# Patient Record
Sex: Male | Born: 1950 | Race: White | Marital: Married | Smoking: Former smoker
Health system: Southern US, Community
[De-identification: ages and names within clinical notes are randomized; demographics above are authoritative.]

## PROBLEM LIST (undated history)

## (undated) DIAGNOSIS — F419 Anxiety disorder, unspecified: Secondary | ICD-10-CM

## (undated) DIAGNOSIS — J189 Pneumonia, unspecified organism: Secondary | ICD-10-CM

## (undated) DIAGNOSIS — A419 Sepsis, unspecified organism: Secondary | ICD-10-CM

## (undated) DIAGNOSIS — E7800 Pure hypercholesterolemia, unspecified: Secondary | ICD-10-CM

## (undated) DIAGNOSIS — I509 Heart failure, unspecified: Secondary | ICD-10-CM

## (undated) DIAGNOSIS — K219 Gastro-esophageal reflux disease without esophagitis: Secondary | ICD-10-CM

## (undated) DIAGNOSIS — G4730 Sleep apnea, unspecified: Secondary | ICD-10-CM

## (undated) DIAGNOSIS — I2510 Atherosclerotic heart disease of native coronary artery without angina pectoris: Secondary | ICD-10-CM

## (undated) DIAGNOSIS — E119 Type 2 diabetes mellitus without complications: Secondary | ICD-10-CM

## (undated) DIAGNOSIS — I4891 Unspecified atrial fibrillation: Secondary | ICD-10-CM

## (undated) DIAGNOSIS — I10 Essential (primary) hypertension: Secondary | ICD-10-CM

## (undated) HISTORY — PX: PICC LINE PLACE PERIPHERAL (ARMC HX): HXRAD1248

## (undated) HISTORY — PX: CHOLECYSTECTOMY: SHX55

## (undated) HISTORY — PX: KNEE SURGERY: SHX244

---

## 2016-08-20 DIAGNOSIS — I2510 Atherosclerotic heart disease of native coronary artery without angina pectoris: Secondary | ICD-10-CM | POA: Diagnosis not present

## 2016-08-20 DIAGNOSIS — R000 Tachycardia, unspecified: Secondary | ICD-10-CM

## 2016-08-20 DIAGNOSIS — E119 Type 2 diabetes mellitus without complications: Secondary | ICD-10-CM

## 2016-08-20 DIAGNOSIS — J449 Chronic obstructive pulmonary disease, unspecified: Secondary | ICD-10-CM

## 2016-08-20 DIAGNOSIS — J9621 Acute and chronic respiratory failure with hypoxia: Secondary | ICD-10-CM

## 2016-08-20 DIAGNOSIS — F419 Anxiety disorder, unspecified: Secondary | ICD-10-CM | POA: Diagnosis not present

## 2016-08-21 DIAGNOSIS — I2510 Atherosclerotic heart disease of native coronary artery without angina pectoris: Secondary | ICD-10-CM | POA: Diagnosis not present

## 2016-08-21 DIAGNOSIS — F419 Anxiety disorder, unspecified: Secondary | ICD-10-CM | POA: Diagnosis not present

## 2016-08-21 DIAGNOSIS — J9621 Acute and chronic respiratory failure with hypoxia: Secondary | ICD-10-CM | POA: Diagnosis not present

## 2016-08-21 DIAGNOSIS — R000 Tachycardia, unspecified: Secondary | ICD-10-CM | POA: Diagnosis not present

## 2016-08-22 DIAGNOSIS — J449 Chronic obstructive pulmonary disease, unspecified: Secondary | ICD-10-CM

## 2016-08-22 DIAGNOSIS — E119 Type 2 diabetes mellitus without complications: Secondary | ICD-10-CM

## 2016-08-22 DIAGNOSIS — R000 Tachycardia, unspecified: Secondary | ICD-10-CM

## 2016-08-22 DIAGNOSIS — F419 Anxiety disorder, unspecified: Secondary | ICD-10-CM

## 2016-08-22 DIAGNOSIS — J9621 Acute and chronic respiratory failure with hypoxia: Secondary | ICD-10-CM | POA: Diagnosis not present

## 2016-08-22 DIAGNOSIS — I2510 Atherosclerotic heart disease of native coronary artery without angina pectoris: Secondary | ICD-10-CM

## 2016-08-23 DIAGNOSIS — I2510 Atherosclerotic heart disease of native coronary artery without angina pectoris: Secondary | ICD-10-CM | POA: Diagnosis not present

## 2016-08-23 DIAGNOSIS — R000 Tachycardia, unspecified: Secondary | ICD-10-CM | POA: Diagnosis not present

## 2016-08-23 DIAGNOSIS — J9621 Acute and chronic respiratory failure with hypoxia: Secondary | ICD-10-CM | POA: Diagnosis not present

## 2016-08-23 DIAGNOSIS — F419 Anxiety disorder, unspecified: Secondary | ICD-10-CM | POA: Diagnosis not present

## 2016-08-24 DIAGNOSIS — I2510 Atherosclerotic heart disease of native coronary artery without angina pectoris: Secondary | ICD-10-CM | POA: Diagnosis not present

## 2016-08-24 DIAGNOSIS — F419 Anxiety disorder, unspecified: Secondary | ICD-10-CM | POA: Diagnosis not present

## 2016-08-24 DIAGNOSIS — J9621 Acute and chronic respiratory failure with hypoxia: Secondary | ICD-10-CM | POA: Diagnosis not present

## 2016-08-24 DIAGNOSIS — R000 Tachycardia, unspecified: Secondary | ICD-10-CM | POA: Diagnosis not present

## 2016-08-25 DIAGNOSIS — F419 Anxiety disorder, unspecified: Secondary | ICD-10-CM | POA: Diagnosis not present

## 2016-08-25 DIAGNOSIS — R000 Tachycardia, unspecified: Secondary | ICD-10-CM | POA: Diagnosis not present

## 2016-08-25 DIAGNOSIS — J9621 Acute and chronic respiratory failure with hypoxia: Secondary | ICD-10-CM | POA: Diagnosis not present

## 2016-08-25 DIAGNOSIS — I2510 Atherosclerotic heart disease of native coronary artery without angina pectoris: Secondary | ICD-10-CM | POA: Diagnosis not present

## 2016-09-02 DIAGNOSIS — J189 Pneumonia, unspecified organism: Secondary | ICD-10-CM

## 2016-09-02 DIAGNOSIS — J441 Chronic obstructive pulmonary disease with (acute) exacerbation: Secondary | ICD-10-CM

## 2016-09-02 DIAGNOSIS — J9620 Acute and chronic respiratory failure, unspecified whether with hypoxia or hypercapnia: Secondary | ICD-10-CM | POA: Diagnosis not present

## 2016-09-02 DIAGNOSIS — E119 Type 2 diabetes mellitus without complications: Secondary | ICD-10-CM

## 2016-09-02 DIAGNOSIS — Z7901 Long term (current) use of anticoagulants: Secondary | ICD-10-CM

## 2016-09-02 DIAGNOSIS — I4891 Unspecified atrial fibrillation: Secondary | ICD-10-CM

## 2016-09-02 DIAGNOSIS — A419 Sepsis, unspecified organism: Secondary | ICD-10-CM | POA: Diagnosis not present

## 2016-09-03 DIAGNOSIS — J9620 Acute and chronic respiratory failure, unspecified whether with hypoxia or hypercapnia: Secondary | ICD-10-CM | POA: Diagnosis not present

## 2016-09-03 DIAGNOSIS — A419 Sepsis, unspecified organism: Secondary | ICD-10-CM | POA: Diagnosis not present

## 2016-09-03 DIAGNOSIS — I4891 Unspecified atrial fibrillation: Secondary | ICD-10-CM | POA: Diagnosis not present

## 2016-09-03 DIAGNOSIS — J189 Pneumonia, unspecified organism: Secondary | ICD-10-CM | POA: Diagnosis not present

## 2016-09-04 DIAGNOSIS — E785 Hyperlipidemia, unspecified: Secondary | ICD-10-CM

## 2016-09-04 DIAGNOSIS — J189 Pneumonia, unspecified organism: Secondary | ICD-10-CM | POA: Diagnosis not present

## 2016-09-04 DIAGNOSIS — E119 Type 2 diabetes mellitus without complications: Secondary | ICD-10-CM

## 2016-09-04 DIAGNOSIS — K219 Gastro-esophageal reflux disease without esophagitis: Secondary | ICD-10-CM

## 2016-09-04 DIAGNOSIS — I5023 Acute on chronic systolic (congestive) heart failure: Secondary | ICD-10-CM

## 2016-09-04 DIAGNOSIS — J441 Chronic obstructive pulmonary disease with (acute) exacerbation: Secondary | ICD-10-CM

## 2016-09-04 DIAGNOSIS — J9621 Acute and chronic respiratory failure with hypoxia: Secondary | ICD-10-CM

## 2016-09-04 DIAGNOSIS — I482 Chronic atrial fibrillation: Secondary | ICD-10-CM

## 2016-09-04 DIAGNOSIS — A419 Sepsis, unspecified organism: Secondary | ICD-10-CM | POA: Diagnosis not present

## 2016-09-04 DIAGNOSIS — I10 Essential (primary) hypertension: Secondary | ICD-10-CM

## 2016-09-05 DIAGNOSIS — A419 Sepsis, unspecified organism: Secondary | ICD-10-CM | POA: Diagnosis not present

## 2016-09-05 DIAGNOSIS — I5023 Acute on chronic systolic (congestive) heart failure: Secondary | ICD-10-CM | POA: Diagnosis not present

## 2016-09-05 DIAGNOSIS — J189 Pneumonia, unspecified organism: Secondary | ICD-10-CM | POA: Diagnosis not present

## 2016-09-05 DIAGNOSIS — J9621 Acute and chronic respiratory failure with hypoxia: Secondary | ICD-10-CM | POA: Diagnosis not present

## 2016-09-06 DIAGNOSIS — A419 Sepsis, unspecified organism: Secondary | ICD-10-CM | POA: Diagnosis not present

## 2016-09-06 DIAGNOSIS — J9621 Acute and chronic respiratory failure with hypoxia: Secondary | ICD-10-CM | POA: Diagnosis not present

## 2016-09-06 DIAGNOSIS — J189 Pneumonia, unspecified organism: Secondary | ICD-10-CM | POA: Diagnosis not present

## 2016-09-06 DIAGNOSIS — I5023 Acute on chronic systolic (congestive) heart failure: Secondary | ICD-10-CM | POA: Diagnosis not present

## 2016-10-08 DIAGNOSIS — Z9119 Patient's noncompliance with other medical treatment and regimen: Secondary | ICD-10-CM | POA: Diagnosis not present

## 2016-10-08 DIAGNOSIS — I4891 Unspecified atrial fibrillation: Secondary | ICD-10-CM

## 2016-10-08 DIAGNOSIS — I959 Hypotension, unspecified: Secondary | ICD-10-CM | POA: Diagnosis not present

## 2016-10-08 DIAGNOSIS — J449 Chronic obstructive pulmonary disease, unspecified: Secondary | ICD-10-CM | POA: Diagnosis not present

## 2016-10-08 DIAGNOSIS — J9620 Acute and chronic respiratory failure, unspecified whether with hypoxia or hypercapnia: Secondary | ICD-10-CM | POA: Diagnosis not present

## 2016-10-08 DIAGNOSIS — Z87891 Personal history of nicotine dependence: Secondary | ICD-10-CM

## 2016-10-08 DIAGNOSIS — I5023 Acute on chronic systolic (congestive) heart failure: Secondary | ICD-10-CM

## 2016-10-09 DIAGNOSIS — J9620 Acute and chronic respiratory failure, unspecified whether with hypoxia or hypercapnia: Secondary | ICD-10-CM | POA: Diagnosis not present

## 2016-10-09 DIAGNOSIS — Z9119 Patient's noncompliance with other medical treatment and regimen: Secondary | ICD-10-CM | POA: Diagnosis not present

## 2016-10-09 DIAGNOSIS — J449 Chronic obstructive pulmonary disease, unspecified: Secondary | ICD-10-CM | POA: Diagnosis not present

## 2016-10-09 DIAGNOSIS — I959 Hypotension, unspecified: Secondary | ICD-10-CM | POA: Diagnosis not present

## 2016-10-10 DIAGNOSIS — Z9119 Patient's noncompliance with other medical treatment and regimen: Secondary | ICD-10-CM

## 2016-10-10 DIAGNOSIS — J9620 Acute and chronic respiratory failure, unspecified whether with hypoxia or hypercapnia: Secondary | ICD-10-CM

## 2016-10-10 DIAGNOSIS — Z87891 Personal history of nicotine dependence: Secondary | ICD-10-CM

## 2016-10-10 DIAGNOSIS — I959 Hypotension, unspecified: Secondary | ICD-10-CM

## 2016-10-10 DIAGNOSIS — J449 Chronic obstructive pulmonary disease, unspecified: Secondary | ICD-10-CM

## 2016-10-10 DIAGNOSIS — I4891 Unspecified atrial fibrillation: Secondary | ICD-10-CM

## 2016-10-10 DIAGNOSIS — I5023 Acute on chronic systolic (congestive) heart failure: Secondary | ICD-10-CM

## 2016-10-11 DIAGNOSIS — J9620 Acute and chronic respiratory failure, unspecified whether with hypoxia or hypercapnia: Secondary | ICD-10-CM | POA: Diagnosis not present

## 2016-10-11 DIAGNOSIS — E875 Hyperkalemia: Secondary | ICD-10-CM

## 2016-10-11 DIAGNOSIS — I959 Hypotension, unspecified: Secondary | ICD-10-CM | POA: Diagnosis not present

## 2016-10-11 DIAGNOSIS — Z9119 Patient's noncompliance with other medical treatment and regimen: Secondary | ICD-10-CM | POA: Diagnosis not present

## 2016-10-11 DIAGNOSIS — J449 Chronic obstructive pulmonary disease, unspecified: Secondary | ICD-10-CM | POA: Diagnosis not present

## 2016-10-12 DIAGNOSIS — J449 Chronic obstructive pulmonary disease, unspecified: Secondary | ICD-10-CM | POA: Diagnosis not present

## 2016-10-12 DIAGNOSIS — J9620 Acute and chronic respiratory failure, unspecified whether with hypoxia or hypercapnia: Secondary | ICD-10-CM | POA: Diagnosis not present

## 2016-10-12 DIAGNOSIS — I959 Hypotension, unspecified: Secondary | ICD-10-CM | POA: Diagnosis not present

## 2016-10-12 DIAGNOSIS — Z9119 Patient's noncompliance with other medical treatment and regimen: Secondary | ICD-10-CM | POA: Diagnosis not present

## 2016-10-13 DIAGNOSIS — Z9119 Patient's noncompliance with other medical treatment and regimen: Secondary | ICD-10-CM | POA: Diagnosis not present

## 2016-10-13 DIAGNOSIS — J9620 Acute and chronic respiratory failure, unspecified whether with hypoxia or hypercapnia: Secondary | ICD-10-CM | POA: Diagnosis not present

## 2016-10-13 DIAGNOSIS — I959 Hypotension, unspecified: Secondary | ICD-10-CM | POA: Diagnosis not present

## 2016-10-13 DIAGNOSIS — J449 Chronic obstructive pulmonary disease, unspecified: Secondary | ICD-10-CM | POA: Diagnosis not present

## 2016-10-14 DIAGNOSIS — Z9119 Patient's noncompliance with other medical treatment and regimen: Secondary | ICD-10-CM | POA: Diagnosis not present

## 2016-10-14 DIAGNOSIS — J449 Chronic obstructive pulmonary disease, unspecified: Secondary | ICD-10-CM | POA: Diagnosis not present

## 2016-10-14 DIAGNOSIS — J9620 Acute and chronic respiratory failure, unspecified whether with hypoxia or hypercapnia: Secondary | ICD-10-CM | POA: Diagnosis not present

## 2016-10-14 DIAGNOSIS — I959 Hypotension, unspecified: Secondary | ICD-10-CM | POA: Diagnosis not present

## 2016-10-15 DIAGNOSIS — Z9119 Patient's noncompliance with other medical treatment and regimen: Secondary | ICD-10-CM | POA: Diagnosis not present

## 2016-10-15 DIAGNOSIS — I959 Hypotension, unspecified: Secondary | ICD-10-CM | POA: Diagnosis not present

## 2016-10-15 DIAGNOSIS — J449 Chronic obstructive pulmonary disease, unspecified: Secondary | ICD-10-CM | POA: Diagnosis not present

## 2016-10-15 DIAGNOSIS — J9620 Acute and chronic respiratory failure, unspecified whether with hypoxia or hypercapnia: Secondary | ICD-10-CM | POA: Diagnosis not present

## 2016-10-16 DIAGNOSIS — J9620 Acute and chronic respiratory failure, unspecified whether with hypoxia or hypercapnia: Secondary | ICD-10-CM | POA: Diagnosis not present

## 2016-10-16 DIAGNOSIS — I959 Hypotension, unspecified: Secondary | ICD-10-CM | POA: Diagnosis not present

## 2016-10-16 DIAGNOSIS — Z9119 Patient's noncompliance with other medical treatment and regimen: Secondary | ICD-10-CM | POA: Diagnosis not present

## 2016-10-16 DIAGNOSIS — J449 Chronic obstructive pulmonary disease, unspecified: Secondary | ICD-10-CM | POA: Diagnosis not present

## 2016-10-17 DIAGNOSIS — J9620 Acute and chronic respiratory failure, unspecified whether with hypoxia or hypercapnia: Secondary | ICD-10-CM | POA: Diagnosis not present

## 2016-10-17 DIAGNOSIS — I959 Hypotension, unspecified: Secondary | ICD-10-CM | POA: Diagnosis not present

## 2016-10-17 DIAGNOSIS — Z9119 Patient's noncompliance with other medical treatment and regimen: Secondary | ICD-10-CM | POA: Diagnosis not present

## 2016-10-17 DIAGNOSIS — J449 Chronic obstructive pulmonary disease, unspecified: Secondary | ICD-10-CM | POA: Diagnosis not present

## 2016-10-18 DIAGNOSIS — J449 Chronic obstructive pulmonary disease, unspecified: Secondary | ICD-10-CM | POA: Diagnosis not present

## 2016-10-18 DIAGNOSIS — J9620 Acute and chronic respiratory failure, unspecified whether with hypoxia or hypercapnia: Secondary | ICD-10-CM | POA: Diagnosis not present

## 2016-10-18 DIAGNOSIS — Z9119 Patient's noncompliance with other medical treatment and regimen: Secondary | ICD-10-CM | POA: Diagnosis not present

## 2016-10-18 DIAGNOSIS — I959 Hypotension, unspecified: Secondary | ICD-10-CM | POA: Diagnosis not present

## 2016-10-19 DIAGNOSIS — J449 Chronic obstructive pulmonary disease, unspecified: Secondary | ICD-10-CM

## 2016-10-19 DIAGNOSIS — J9620 Acute and chronic respiratory failure, unspecified whether with hypoxia or hypercapnia: Secondary | ICD-10-CM

## 2016-10-19 DIAGNOSIS — I5023 Acute on chronic systolic (congestive) heart failure: Secondary | ICD-10-CM

## 2016-10-19 DIAGNOSIS — Z87891 Personal history of nicotine dependence: Secondary | ICD-10-CM

## 2016-10-19 DIAGNOSIS — I959 Hypotension, unspecified: Secondary | ICD-10-CM

## 2016-10-19 DIAGNOSIS — E875 Hyperkalemia: Secondary | ICD-10-CM

## 2016-10-19 DIAGNOSIS — Z9119 Patient's noncompliance with other medical treatment and regimen: Secondary | ICD-10-CM

## 2016-10-19 DIAGNOSIS — I4891 Unspecified atrial fibrillation: Secondary | ICD-10-CM

## 2016-10-20 DIAGNOSIS — Z9119 Patient's noncompliance with other medical treatment and regimen: Secondary | ICD-10-CM | POA: Diagnosis not present

## 2016-10-20 DIAGNOSIS — J9620 Acute and chronic respiratory failure, unspecified whether with hypoxia or hypercapnia: Secondary | ICD-10-CM | POA: Diagnosis not present

## 2016-10-20 DIAGNOSIS — J449 Chronic obstructive pulmonary disease, unspecified: Secondary | ICD-10-CM | POA: Diagnosis not present

## 2016-10-20 DIAGNOSIS — I959 Hypotension, unspecified: Secondary | ICD-10-CM | POA: Diagnosis not present

## 2016-10-21 DIAGNOSIS — J9620 Acute and chronic respiratory failure, unspecified whether with hypoxia or hypercapnia: Secondary | ICD-10-CM | POA: Diagnosis not present

## 2016-10-21 DIAGNOSIS — J449 Chronic obstructive pulmonary disease, unspecified: Secondary | ICD-10-CM | POA: Diagnosis not present

## 2016-10-21 DIAGNOSIS — I959 Hypotension, unspecified: Secondary | ICD-10-CM | POA: Diagnosis not present

## 2016-10-21 DIAGNOSIS — Z9119 Patient's noncompliance with other medical treatment and regimen: Secondary | ICD-10-CM | POA: Diagnosis not present

## 2016-10-22 DIAGNOSIS — J449 Chronic obstructive pulmonary disease, unspecified: Secondary | ICD-10-CM | POA: Diagnosis not present

## 2016-10-22 DIAGNOSIS — J9620 Acute and chronic respiratory failure, unspecified whether with hypoxia or hypercapnia: Secondary | ICD-10-CM | POA: Diagnosis not present

## 2016-10-22 DIAGNOSIS — Z9119 Patient's noncompliance with other medical treatment and regimen: Secondary | ICD-10-CM | POA: Diagnosis not present

## 2016-10-22 DIAGNOSIS — I959 Hypotension, unspecified: Secondary | ICD-10-CM | POA: Diagnosis not present

## 2016-10-23 DIAGNOSIS — I959 Hypotension, unspecified: Secondary | ICD-10-CM | POA: Diagnosis not present

## 2016-10-23 DIAGNOSIS — J9620 Acute and chronic respiratory failure, unspecified whether with hypoxia or hypercapnia: Secondary | ICD-10-CM | POA: Diagnosis not present

## 2016-10-23 DIAGNOSIS — J449 Chronic obstructive pulmonary disease, unspecified: Secondary | ICD-10-CM | POA: Diagnosis not present

## 2016-10-23 DIAGNOSIS — Z9119 Patient's noncompliance with other medical treatment and regimen: Secondary | ICD-10-CM | POA: Diagnosis not present

## 2016-10-24 DIAGNOSIS — J449 Chronic obstructive pulmonary disease, unspecified: Secondary | ICD-10-CM | POA: Diagnosis not present

## 2016-10-24 DIAGNOSIS — I959 Hypotension, unspecified: Secondary | ICD-10-CM | POA: Diagnosis not present

## 2016-10-24 DIAGNOSIS — J9620 Acute and chronic respiratory failure, unspecified whether with hypoxia or hypercapnia: Secondary | ICD-10-CM | POA: Diagnosis not present

## 2016-10-24 DIAGNOSIS — Z9119 Patient's noncompliance with other medical treatment and regimen: Secondary | ICD-10-CM | POA: Diagnosis not present

## 2016-10-25 DIAGNOSIS — I959 Hypotension, unspecified: Secondary | ICD-10-CM

## 2016-10-25 DIAGNOSIS — J9620 Acute and chronic respiratory failure, unspecified whether with hypoxia or hypercapnia: Secondary | ICD-10-CM | POA: Diagnosis not present

## 2016-10-25 DIAGNOSIS — J449 Chronic obstructive pulmonary disease, unspecified: Secondary | ICD-10-CM

## 2016-10-25 DIAGNOSIS — I5023 Acute on chronic systolic (congestive) heart failure: Secondary | ICD-10-CM

## 2016-10-25 DIAGNOSIS — Z87891 Personal history of nicotine dependence: Secondary | ICD-10-CM

## 2016-10-25 DIAGNOSIS — Z9119 Patient's noncompliance with other medical treatment and regimen: Secondary | ICD-10-CM

## 2016-10-25 DIAGNOSIS — I4891 Unspecified atrial fibrillation: Secondary | ICD-10-CM

## 2016-10-25 DIAGNOSIS — E875 Hyperkalemia: Secondary | ICD-10-CM

## 2016-10-26 DIAGNOSIS — Z9119 Patient's noncompliance with other medical treatment and regimen: Secondary | ICD-10-CM | POA: Diagnosis not present

## 2016-10-26 DIAGNOSIS — I959 Hypotension, unspecified: Secondary | ICD-10-CM | POA: Diagnosis not present

## 2016-10-26 DIAGNOSIS — J449 Chronic obstructive pulmonary disease, unspecified: Secondary | ICD-10-CM | POA: Diagnosis not present

## 2016-10-26 DIAGNOSIS — J9620 Acute and chronic respiratory failure, unspecified whether with hypoxia or hypercapnia: Secondary | ICD-10-CM | POA: Diagnosis not present

## 2016-10-27 ENCOUNTER — Inpatient Hospital Stay (HOSPITAL_COMMUNITY): Payer: Medicare Other

## 2016-10-27 ENCOUNTER — Encounter (HOSPITAL_COMMUNITY): Payer: Self-pay

## 2016-10-27 ENCOUNTER — Emergency Department (HOSPITAL_COMMUNITY): Payer: Medicare Other

## 2016-10-27 ENCOUNTER — Inpatient Hospital Stay (HOSPITAL_COMMUNITY)
Admission: EM | Admit: 2016-10-27 | Discharge: 2016-11-13 | DRG: 193 | Disposition: A | Discharge status: Skilled Nursing Facility | Payer: Medicare Other | Attending: Family Medicine | Admitting: Family Medicine

## 2016-10-27 DIAGNOSIS — I248 Other forms of acute ischemic heart disease: Secondary | ICD-10-CM | POA: Diagnosis present

## 2016-10-27 DIAGNOSIS — J9621 Acute and chronic respiratory failure with hypoxia: Secondary | ICD-10-CM

## 2016-10-27 DIAGNOSIS — D649 Anemia, unspecified: Secondary | ICD-10-CM | POA: Diagnosis present

## 2016-10-27 DIAGNOSIS — G9341 Metabolic encephalopathy: Secondary | ICD-10-CM

## 2016-10-27 DIAGNOSIS — J440 Chronic obstructive pulmonary disease with acute lower respiratory infection: Secondary | ICD-10-CM | POA: Diagnosis present

## 2016-10-27 DIAGNOSIS — J431 Panlobular emphysema: Secondary | ICD-10-CM | POA: Diagnosis not present

## 2016-10-27 DIAGNOSIS — Y95 Nosocomial condition: Secondary | ICD-10-CM | POA: Diagnosis present

## 2016-10-27 DIAGNOSIS — N183 Chronic kidney disease, stage 3 (moderate): Secondary | ICD-10-CM | POA: Diagnosis present

## 2016-10-27 DIAGNOSIS — E1122 Type 2 diabetes mellitus with diabetic chronic kidney disease: Secondary | ICD-10-CM | POA: Diagnosis present

## 2016-10-27 DIAGNOSIS — I2489 Other forms of acute ischemic heart disease: Secondary | ICD-10-CM

## 2016-10-27 DIAGNOSIS — I5022 Chronic systolic (congestive) heart failure: Secondary | ICD-10-CM

## 2016-10-27 DIAGNOSIS — N179 Acute kidney failure, unspecified: Secondary | ICD-10-CM | POA: Diagnosis present

## 2016-10-27 DIAGNOSIS — J9622 Acute and chronic respiratory failure with hypercapnia: Secondary | ICD-10-CM | POA: Diagnosis present

## 2016-10-27 DIAGNOSIS — J810 Acute pulmonary edema: Secondary | ICD-10-CM

## 2016-10-27 DIAGNOSIS — J441 Chronic obstructive pulmonary disease with (acute) exacerbation: Secondary | ICD-10-CM

## 2016-10-27 DIAGNOSIS — J9690 Respiratory failure, unspecified, unspecified whether with hypoxia or hypercapnia: Secondary | ICD-10-CM | POA: Diagnosis present

## 2016-10-27 DIAGNOSIS — I1310 Hypertensive heart and chronic kidney disease without heart failure, with stage 1 through stage 4 chronic kidney disease, or unspecified chronic kidney disease: Secondary | ICD-10-CM | POA: Diagnosis not present

## 2016-10-27 DIAGNOSIS — R7881 Bacteremia: Secondary | ICD-10-CM | POA: Diagnosis present

## 2016-10-27 DIAGNOSIS — N170 Acute kidney failure with tubular necrosis: Secondary | ICD-10-CM | POA: Diagnosis not present

## 2016-10-27 DIAGNOSIS — J9601 Acute respiratory failure with hypoxia: Secondary | ICD-10-CM

## 2016-10-27 DIAGNOSIS — T460X5A Adverse effect of cardiac-stimulant glycosides and drugs of similar action, initial encounter: Secondary | ICD-10-CM | POA: Diagnosis present

## 2016-10-27 DIAGNOSIS — R197 Diarrhea, unspecified: Secondary | ICD-10-CM

## 2016-10-27 DIAGNOSIS — I2510 Atherosclerotic heart disease of native coronary artery without angina pectoris: Secondary | ICD-10-CM | POA: Diagnosis present

## 2016-10-27 DIAGNOSIS — E118 Type 2 diabetes mellitus with unspecified complications: Secondary | ICD-10-CM

## 2016-10-27 DIAGNOSIS — E785 Hyperlipidemia, unspecified: Secondary | ICD-10-CM | POA: Diagnosis present

## 2016-10-27 DIAGNOSIS — I4891 Unspecified atrial fibrillation: Secondary | ICD-10-CM

## 2016-10-27 DIAGNOSIS — E1165 Type 2 diabetes mellitus with hyperglycemia: Secondary | ICD-10-CM | POA: Diagnosis present

## 2016-10-27 DIAGNOSIS — F419 Anxiety disorder, unspecified: Secondary | ICD-10-CM | POA: Diagnosis present

## 2016-10-27 DIAGNOSIS — R0600 Dyspnea, unspecified: Secondary | ICD-10-CM | POA: Diagnosis not present

## 2016-10-27 DIAGNOSIS — Z880 Allergy status to penicillin: Secondary | ICD-10-CM

## 2016-10-27 DIAGNOSIS — R4182 Altered mental status, unspecified: Secondary | ICD-10-CM | POA: Diagnosis present

## 2016-10-27 DIAGNOSIS — K219 Gastro-esophageal reflux disease without esophagitis: Secondary | ICD-10-CM | POA: Diagnosis present

## 2016-10-27 DIAGNOSIS — I130 Hypertensive heart and chronic kidney disease with heart failure and stage 1 through stage 4 chronic kidney disease, or unspecified chronic kidney disease: Secondary | ICD-10-CM | POA: Diagnosis present

## 2016-10-27 DIAGNOSIS — G9340 Encephalopathy, unspecified: Secondary | ICD-10-CM | POA: Diagnosis not present

## 2016-10-27 DIAGNOSIS — J189 Pneumonia, unspecified organism: Principal | ICD-10-CM

## 2016-10-27 DIAGNOSIS — Z4659 Encounter for fitting and adjustment of other gastrointestinal appliance and device: Secondary | ICD-10-CM

## 2016-10-27 DIAGNOSIS — E876 Hypokalemia: Secondary | ICD-10-CM | POA: Diagnosis present

## 2016-10-27 DIAGNOSIS — M7989 Other specified soft tissue disorders: Secondary | ICD-10-CM

## 2016-10-27 DIAGNOSIS — E7800 Pure hypercholesterolemia, unspecified: Secondary | ICD-10-CM | POA: Diagnosis present

## 2016-10-27 DIAGNOSIS — R0603 Acute respiratory distress: Secondary | ICD-10-CM

## 2016-10-27 DIAGNOSIS — L8990 Pressure ulcer of unspecified site, unspecified stage: Secondary | ICD-10-CM | POA: Insufficient documentation

## 2016-10-27 DIAGNOSIS — R609 Edema, unspecified: Secondary | ICD-10-CM | POA: Diagnosis not present

## 2016-10-27 DIAGNOSIS — R2689 Other abnormalities of gait and mobility: Secondary | ICD-10-CM

## 2016-10-27 DIAGNOSIS — G4733 Obstructive sleep apnea (adult) (pediatric): Secondary | ICD-10-CM | POA: Diagnosis present

## 2016-10-27 DIAGNOSIS — Z7901 Long term (current) use of anticoagulants: Secondary | ICD-10-CM

## 2016-10-27 DIAGNOSIS — E46 Unspecified protein-calorie malnutrition: Secondary | ICD-10-CM | POA: Diagnosis present

## 2016-10-27 DIAGNOSIS — K56609 Unspecified intestinal obstruction, unspecified as to partial versus complete obstruction: Secondary | ICD-10-CM

## 2016-10-27 HISTORY — DX: Heart failure, unspecified: I50.9

## 2016-10-27 HISTORY — DX: Gastro-esophageal reflux disease without esophagitis: K21.9

## 2016-10-27 HISTORY — DX: Essential (primary) hypertension: I10

## 2016-10-27 HISTORY — DX: Type 2 diabetes mellitus without complications: E11.9

## 2016-10-27 HISTORY — DX: Pure hypercholesterolemia, unspecified: E78.00

## 2016-10-27 HISTORY — DX: Sepsis, unspecified organism: A41.9

## 2016-10-27 HISTORY — DX: Pneumonia, unspecified organism: J18.9

## 2016-10-27 HISTORY — DX: Unspecified atrial fibrillation: I48.91

## 2016-10-27 HISTORY — DX: Sleep apnea, unspecified: G47.30

## 2016-10-27 HISTORY — DX: Anxiety disorder, unspecified: F41.9

## 2016-10-27 HISTORY — DX: Atherosclerotic heart disease of native coronary artery without angina pectoris: I25.10

## 2016-10-27 LAB — CBC WITH DIFFERENTIAL/PLATELET
BASOS ABS: 0.1 10*3/uL (ref 0.0–0.1)
BASOS PCT: 1 %
EOS ABS: 0 10*3/uL (ref 0.0–0.7)
Eosinophils Relative: 0 %
HCT: 38.1 % — ABNORMAL LOW (ref 39.0–52.0)
HEMOGLOBIN: 12.1 g/dL — AB (ref 13.0–17.0)
Lymphocytes Relative: 7 %
Lymphs Abs: 0.7 10*3/uL (ref 0.7–4.0)
MCH: 30.6 pg (ref 26.0–34.0)
MCHC: 31.8 g/dL (ref 30.0–36.0)
MCV: 96.2 fL (ref 78.0–100.0)
Monocytes Absolute: 1.6 10*3/uL — ABNORMAL HIGH (ref 0.1–1.0)
Monocytes Relative: 15 %
NEUTROS PCT: 77 %
Neutro Abs: 7.9 10*3/uL — ABNORMAL HIGH (ref 1.7–7.7)
Platelets: 277 10*3/uL (ref 150–400)
RBC: 3.96 MIL/uL — AB (ref 4.22–5.81)
RDW: 14.3 % (ref 11.5–15.5)
WBC: 10.3 10*3/uL (ref 4.0–10.5)

## 2016-10-27 LAB — COMPREHENSIVE METABOLIC PANEL
ALK PHOS: 122 U/L (ref 38–126)
ALT: 14 U/L — AB (ref 17–63)
AST: 17 U/L (ref 15–41)
Albumin: 2.2 g/dL — ABNORMAL LOW (ref 3.5–5.0)
Anion gap: 9 (ref 5–15)
BUN: 17 mg/dL (ref 6–20)
CALCIUM: 8.5 mg/dL — AB (ref 8.9–10.3)
CO2: 27 mmol/L (ref 22–32)
CREATININE: 3.09 mg/dL — AB (ref 0.61–1.24)
Chloride: 101 mmol/L (ref 101–111)
GFR, EST AFRICAN AMERICAN: 23 mL/min — AB (ref >60)
GFR, EST NON AFRICAN AMERICAN: 20 mL/min — AB (ref >60)
Glucose, Bld: 146 mg/dL — ABNORMAL HIGH (ref 65–99)
Potassium: 4.8 mmol/L (ref 3.5–5.1)
Sodium: 137 mmol/L (ref 135–145)
Total Bilirubin: 0.5 mg/dL (ref 0.3–1.2)
Total Protein: 4.7 g/dL — ABNORMAL LOW (ref 6.5–8.1)

## 2016-10-27 LAB — GLUCOSE, CAPILLARY: Glucose-Capillary: 131 mg/dL — ABNORMAL HIGH (ref 65–99)

## 2016-10-27 LAB — CBC
HEMATOCRIT: 36.4 % — AB (ref 39.0–52.0)
HEMOGLOBIN: 11.6 g/dL — AB (ref 13.0–17.0)
MCH: 30.5 pg (ref 26.0–34.0)
MCHC: 31.9 g/dL (ref 30.0–36.0)
MCV: 95.8 fL (ref 78.0–100.0)
Platelets: 238 10*3/uL (ref 150–400)
RBC: 3.8 MIL/uL — ABNORMAL LOW (ref 4.22–5.81)
RDW: 14.2 % (ref 11.5–15.5)
WBC: 9.2 10*3/uL (ref 4.0–10.5)

## 2016-10-27 LAB — I-STAT ARTERIAL BLOOD GAS, ED
ACID-BASE EXCESS: 1 mmol/L (ref 0.0–2.0)
Acid-Base Excess: 1 mmol/L (ref 0.0–2.0)
Bicarbonate: 28.7 mmol/L — ABNORMAL HIGH (ref 20.0–28.0)
Bicarbonate: 28.7 mmol/L — ABNORMAL HIGH (ref 20.0–28.0)
O2 SAT: 97 %
O2 SAT: 95 %
PCO2 ART: 59.8 mmHg — AB (ref 32.0–48.0)
PCO2 ART: 56.4 mmHg — AB (ref 32.0–48.0)
PH ART: 7.288 — AB (ref 7.350–7.450)
PH ART: 7.314 — AB (ref 7.350–7.450)
TCO2: 30 mmol/L (ref 0–100)
TCO2: 30 mmol/L (ref 0–100)
pO2, Arterial: 104 mmHg (ref 83.0–108.0)
pO2, Arterial: 81 mmHg — ABNORMAL LOW (ref 83.0–108.0)

## 2016-10-27 LAB — URINALYSIS, ROUTINE W REFLEX MICROSCOPIC
Bilirubin Urine: NEGATIVE
Glucose, UA: NEGATIVE mg/dL
HGB URINE DIPSTICK: NEGATIVE
Ketones, ur: NEGATIVE mg/dL
LEUKOCYTES UA: NEGATIVE
Nitrite: NEGATIVE
PROTEIN: NEGATIVE mg/dL
Specific Gravity, Urine: 1.015 (ref 1.005–1.030)
pH: 5 (ref 5.0–8.0)

## 2016-10-27 LAB — POC OCCULT BLOOD, ED: FECAL OCCULT BLD: NEGATIVE

## 2016-10-27 LAB — CREATININE, SERUM
CREATININE: 3.34 mg/dL — AB (ref 0.61–1.24)
GFR, EST AFRICAN AMERICAN: 21 mL/min — AB (ref >60)
GFR, EST NON AFRICAN AMERICAN: 18 mL/min — AB (ref >60)

## 2016-10-27 LAB — PROTIME-INR
INR: 2.59
Prothrombin Time: 28.3 s — ABNORMAL HIGH (ref 11.4–15.2)

## 2016-10-27 LAB — DIGOXIN LEVEL: DIGOXIN LVL: 2.9 ng/mL — AB (ref 0.8–2.0)

## 2016-10-27 LAB — TROPONIN I: TROPONIN I: 0.19 ng/mL — AB (ref <0.03)

## 2016-10-27 MED ORDER — SODIUM CHLORIDE 0.9 % IV SOLN
Freq: Once | INTRAVENOUS | Status: AC
  Administered 2016-10-27: 18:00:00 via INTRAVENOUS

## 2016-10-27 MED ORDER — HEPARIN SODIUM (PORCINE) 5000 UNIT/ML IJ SOLN: 5000.0000 [IU] | Freq: Three times a day (TID) | INTRAMUSCULAR | Status: DC

## 2016-10-27 MED ORDER — SODIUM CHLORIDE 0.9 % IV SOLN
250.0000 mL | INTRAVENOUS | Status: DC | PRN
  Administered 2016-10-28: 1 mL via INTRAVENOUS

## 2016-10-27 MED ORDER — IPRATROPIUM-ALBUTEROL 0.5-2.5 (3) MG/3ML IN SOLN
3.0000 mL | RESPIRATORY_TRACT | Status: DC
  Administered 2016-10-27 – 2016-10-31 (×21): 3 mL via RESPIRATORY_TRACT
  Filled 2016-10-27 (×22): qty 3

## 2016-10-27 MED ORDER — DEXTROSE 5 % IV SOLN
1.0000 g | INTRAVENOUS | Status: AC
  Administered 2016-10-27 – 2016-11-02 (×7): 1 g via INTRAVENOUS
  Filled 2016-10-27 (×7): qty 1

## 2016-10-27 MED ORDER — AMIODARONE HCL 200 MG PO TABS
200.0000 mg | ORAL_TABLET | Freq: Every day | ORAL | Status: DC
  Administered 2016-10-29 – 2016-11-13 (×15): 200 mg via ORAL
  Filled 2016-10-27 (×15): qty 1

## 2016-10-27 MED ORDER — FUROSEMIDE 10 MG/ML IJ SOLN
INTRAMUSCULAR | Status: AC
  Administered 2016-10-27: 40 mg via INTRAVENOUS
  Filled 2016-10-27: qty 4

## 2016-10-27 MED ORDER — ATORVASTATIN CALCIUM 80 MG PO TABS
80.0000 mg | ORAL_TABLET | Freq: Every day | ORAL | Status: DC
  Administered 2016-10-29 – 2016-11-12 (×15): 80 mg via ORAL
  Filled 2016-10-27 (×15): qty 1

## 2016-10-27 MED ORDER — ASPIRIN EC 81 MG PO TBEC
81.0000 mg | DELAYED_RELEASE_TABLET | Freq: Every evening | ORAL | Status: DC
  Administered 2016-10-29: 81 mg via ORAL
  Filled 2016-10-27: qty 1

## 2016-10-27 MED ORDER — FUROSEMIDE 10 MG/ML IJ SOLN
40.0000 mg | Freq: Once | INTRAMUSCULAR | Status: AC
  Administered 2016-10-27: 40 mg via INTRAVENOUS

## 2016-10-27 NOTE — ED Triage Notes (Signed)
Patient arrived from rehab facility for altered loc. Patient was just admitted to this facility yesterday for aspiration pneumonia. This am staff found patient non-verbal and wouldn't take meds nor eat. Patient arrived with right forearm PICC line accessed by nursing home staff. Patient was found hypotensive and low sats on EMS arrival. Patient received 500cc bolus and arrived to ED on NRBM. Patient will mumble when asked questions. Small skin tears noted to bilateral; arms. Denies pain. EMS also reports atrial fib with multiple PVC's prior to arrival.

## 2016-10-27 NOTE — ED Notes (Signed)
CT called to find out status of CT

## 2016-10-27 NOTE — ED Notes (Signed)
RT and this RN escorting pt upstairs to 2S

## 2016-10-27 NOTE — ED Provider Notes (Signed)
[facility]-EMERGENCY DEPT Provider Note   CSN: [id number] Arrival date & time: 10/27/16  1311     History   Chief Complaint Chief Complaint  Patient presents with  . Altered Mental Status   Level V caveat altered mental status history is obtained from records accompanying patient and [facility], LPN nurse at [facility] HPI Dylan Hubbard is a 65 y.o. male. Patient was transferred to [facility] yesterday from inpatient stay at [facility]. He became progressively less arousable this morning. He did refuse his medications and breakfast this morning. He was noted to have blood pressure of 98/38 and pulse of 51 at 10:29 AM as of yesterday patient was alert and talkative. Was discharged from [facility] yesterday and he was admitted on 10/08/2016 and felt to be suffering from acute and chronic congestive heart failure. Cardioversion was attempted, but was unsuccessful. Placed on IV amiodarone and transit addition to digoxin and amiodarone he developed chronic hypoxic and hypercapnic respiratory failure. He was placed on BiPAP and administered Levaquin. EMS performed CBG in the field which was 119 HPI  Past Medical History:  Diagnosis Date  . Anxiety   . Atrial fibrillation (HCC)   . CHF (congestive heart failure) (HCC)   . Coronary artery disease   . Diabetes mellitus without complication (HCC)   . GERD (gastroesophageal reflux disease)   . High cholesterol   . Hypertension   . Pneumonia   . Sepsis (HCC)   . Sleep apnea     There are no active problems to display for this patient.   History reviewed. No pertinent surgical history.     Home Medications    Prior to Admission medications   Not on File    Family History No family history on file.  Social History Social History  Substance Use Topics  . Smoking status: Not on file  . Smokeless tobacco: Not on file  . Alcohol use Not on file     Allergies   Penicillins   Review of  Systems Review of Systems  Unable to perform ROS: Mental status change     Physical Exam Updated Vital Signs BP (!) 117/46 (BP Location: Left Arm)   Pulse (!) 44   Temp 98.3 F (36.8 C) (Rectal)   Resp 17   SpO2 91%   Physical Exam  Constitutional:  Chronically ill-appearing. Opens eyes to verbal stimulus. Squeeze my hand on command. Nonverbal  HENT:  Head: Normocephalic and atraumatic.  Eyes: Conjunctivae are normal. Pupils are equal, round, and reactive to light.  Neck: Neck supple. No tracheal deviation present. No thyromegaly present.  Cardiovascular: Normal rate and regular rhythm.   No murmur heard. Pulmonary/Chest: Effort normal and breath sounds normal.  Abdominal: Soft. Bowel sounds are normal. He exhibits no distension. There is no tenderness.  Genitourinary:  Genitourinary Comments: Rectal normal tone brown stool no gross blood  Musculoskeletal: Normal range of motion. He exhibits no edema or tenderness.  Neurological: Coordination normal.  arousable to verbal stimulus Glasgow Coma Score 10. Moves all extremities. No facial asymmetry cranial nerves II through XII grossly intact  Skin: Skin is warm and dry. No rash noted.  Psychiatric: He has a normal mood and affect.  Nursing note and vitals reviewed.    ED Treatments / Results  Labs (all labs ordered are listed, but only abnormal results are displayed) Labs Reviewed  BLOOD GAS, ARTERIAL  COMPREHENSIVE METABOLIC PANEL  CBC WITH DIFFERENTIAL/PLATELET  TROPONIN I  URINALYSIS,  ROUTINE W REFLEX MICROSCOPIC (NOT AT [facility])  POC OCCULT BLOOD, ED    EKG  EKG Interpretation None      ED ECG REPORT   Date: 10/27/2016  Rate: 40  Rhythm: atrial flutter and premature ventricular contractions (PVC)  QRS Axis: right  Intervals: normal  ST/T Wave abnormalities: nonspecific T wave changes  Conduction Disutrbances:nonspecific intraventricular conduction delay  Narrative Interpretation:   Old EKG Reviewed: none  available  I have personally reviewed the EKG tracing and disagree with the computerized printout as noted. Radiology No results found.  Procedures Procedures (including critical care time)  Medications Ordered in ED Medications - No data to display  Chest x-ray viewed by me Results for orders placed or performed during the hospital encounter of 10/27/16  Comprehensive metabolic panel  Result Value Ref Range   Sodium 137 135 - 145 mmol/L   Potassium 4.8 3.5 - 5.1 mmol/L   Chloride 101 101 - 111 mmol/L   CO2 27 22 - 32 mmol/L   Glucose, Bld 146 (H) 65 - 99 mg/dL   BUN 17 6 - 20 mg/dL   Creatinine, Ser 4.09 (H) 0.61 - 1.24 mg/dL   Calcium 8.5 (L) 8.9 - 10.3 mg/dL   Total Protein 4.7 (L) 6.5 - 8.1 g/dL   Albumin 2.2 (L) 3.5 - 5.0 g/dL   AST 17 15 - 41 U/L   ALT 14 (L) 17 - 63 U/L   Alkaline Phosphatase 122 38 - 126 U/L   Total Bilirubin 0.5 0.3 - 1.2 mg/dL   GFR calc non Af Amer 20 (L) >60 mL/min   GFR calc Af Amer 23 (L) >60 mL/min   Anion gap 9 5 - 15  CBC with Differential/Platelet  Result Value Ref Range   WBC 10.3 4.0 - 10.5 K/uL   RBC 3.96 (L) 4.22 - 5.81 MIL/uL   Hemoglobin 12.1 (L) 13.0 - 17.0 g/dL   HCT 81.1 (L) 91.4 - 78.2 %   MCV 96.2 78.0 - 100.0 fL   MCH 30.6 26.0 - 34.0 pg   MCHC 31.8 30.0 - 36.0 g/dL   RDW 95.6 21.3 - 08.6 %   Platelets 277 150 - 400 K/uL   Neutrophils Relative % 77 %   Neutro Abs 7.9 (H) 1.7 - 7.7 K/uL   Lymphocytes Relative 7 %   Lymphs Abs 0.7 0.7 - 4.0 K/uL   Monocytes Relative 15 %   Monocytes Absolute 1.6 (H) 0.1 - 1.0 K/uL   Eosinophils Relative 0 %   Eosinophils Absolute 0.0 0.0 - 0.7 K/uL   Basophils Relative 1 %   Basophils Absolute 0.1 0.0 - 0.1 K/uL  Troponin I  Result Value Ref Range   Troponin I 0.19 (HH) <0.03 ng/mL  Urinalysis, Routine w reflex microscopic (not at [facility])  Result Value Ref Range   Color, Urine YELLOW YELLOW   APPearance CLOUDY (A) CLEAR   Specific Gravity, Urine 1.015 1.005 - 1.030   pH 5.0 5.0 -  8.0   Glucose, UA NEGATIVE NEGATIVE mg/dL   Hgb urine dipstick NEGATIVE NEGATIVE   Bilirubin Urine NEGATIVE NEGATIVE   Ketones, ur NEGATIVE NEGATIVE mg/dL   Protein, ur NEGATIVE NEGATIVE mg/dL   Nitrite NEGATIVE NEGATIVE   Leukocytes, UA NEGATIVE NEGATIVE  POC occult blood, ED  Result Value Ref Range   Fecal Occult Bld NEGATIVE NEGATIVE  I-Stat arterial blood gas, ED  Result Value Ref Range   pH, Arterial 7.288 (L) 7.350 - 7.450   pCO2 arterial  59.8 (H) 32.0 - 48.0 mmHg   pO2, Arterial 104.0 83.0 - 108.0 mmHg   Bicarbonate 28.7 (H) 20.0 - 28.0 mmol/L   TCO2 30 0 - 100 mmol/L   O2 Saturation 97.0 %   Acid-Base Excess 1.0 0.0 - 2.0 mmol/L   Patient temperature HIDE    Collection site RADIAL, ALLEN'S TEST ACCEPTABLE    Drawn by RT    Sample type ARTERIAL   I-Stat arterial blood gas, ED  Result Value Ref Range   pH, Arterial 7.314 (L) 7.350 - 7.450   pCO2 arterial 56.4 (H) 32.0 - 48.0 mmHg   pO2, Arterial 81.0 (L) 83.0 - 108.0 mmHg   Bicarbonate 28.7 (H) 20.0 - 28.0 mmol/L   TCO2 30 0 - 100 mmol/L   O2 Saturation 95.0 %   Acid-Base Excess 1.0 0.0 - 2.0 mmol/L   Patient temperature HIDE    Collection site RADIAL, ALLEN'S TEST ACCEPTABLE    Drawn by RT    Sample type ARTERIAL    Ct Head Wo Contrast  Result Date: 10/27/2016 CLINICAL DATA:  Altered mental status EXAM: CT HEAD WITHOUT CONTRAST TECHNIQUE: Contiguous axial images were obtained from the base of the skull through the vertex without intravenous contrast. COMPARISON:  None. FINDINGS: Brain: No evidence of acute infarction, hemorrhage, hydrocephalus, extra-axial collection or mass lesion/mass effect. Mild cerebral atrophy. Mild periventricular white matter decreased attenuation probable due to chronic small vessel ischemic changes. Vascular: Atherosclerotic calcifications of carotid siphon are noted. Skull: Normal. Negative for fracture or focal lesion. Sinuses/Orbits: No acute finding. Other: None. IMPRESSION: No acute  intracranial abnormality. Mild cerebral atrophy. Mild periventricular white matter decreased attenuation probable due to chronic small vessel ischemic changes. Atherosclerotic calcifications of carotid siphon. Electronically Signed   By: Natasha Mead M.D.   On: 10/27/2016 17:08   Dg Chest Portable 1 View  Result Date: 10/27/2016 CLINICAL DATA:  65 year old male with a history of PICC confirmation EXAM: PORTABLE CHEST 1 VIEW COMPARISON:  10/25/2016, 10/21/2016 FINDINGS: Right upper extremity PICC, with the tip appearing to terminate superior cavoatrial junction. Asymmetric mixed interstitial and airspace opacity of the right lung. Blunting of the right costophrenic angle and cardiophrenic angle with hazy lower lung opacity. Left lung relatively well aerated. No pneumothorax. IMPRESSION: Right-sided opacity, likely combination of atelectasis/consolidation, edema, and right-sided pleural fluid. Right upper extremity PICC, with the tip appearing to terminate superior cavoatrial junction. Aortic atherosclerosis. Signed, Yvone Neu. Loreta Ave, DO [facility] Electronically Signed   By: Gilmer Mor D.O.   On: 10/27/2016 14:11   Initial Impression / Assessment and Plan / ED Course  I have reviewed the triage vital signs and the nursing notes.  Pertinent labs & imaging results that were available during my care of the patient were reviewed by me and considered in my medical decision making (see chart for details).  Clinical Course    3:30 PM mental status is essentially unchanged. Patient awakens to verbal stimulus for a few seconds and then falls back to sleep. 4:15 PM mental status is unchanged. Patient opens eyes to verbal stimulus and follow simple commands.  Critical care consulted. Patient remained stable on BiPAP. We will not intubate presently. Patient carries radius diagnosis of renal failure. I consulted Dr. although from critical care team. He  will be admitted to the intensive care unit  Final Clinical Impressions(s) / ED Diagnoses  Diagnosis #1 acute encephalopathy  #2 acute on chronic respiratory failure  #3 renal failure  #  4 hyperglycemia  CRITICAL CARE Performed by: Doug SouJACUBOWITZ,Shallen Luedke Total critical care time: 45 minutes Critical care time was exclusive of separately billable procedures and treating other patients. Critical care was necessary to treat or prevent imminent or life-threatening deterioration. Critical care was time spent personally by me on the following activities: development of treatment plan with patient and/or surrogate as well as nursing, discussions with consultants, evaluation of patient's response to treatment, examination of patient, obtaining history from patient or surrogate, ordering and performing treatments and interventions, ordering and review of laboratory studies, ordering and review of radiographic studies, pulse oximetry and re-evaluation of patient's condition. Final diagnoses:  None    New Prescriptions New Prescriptions   No medications on file     Doug SouSam Pepe Mineau, MD 10/27/16 1743

## 2016-10-27 NOTE — Progress Notes (Signed)
Pharmacy Antibiotic Note  Dylan Hubbard is a 65 y.o. male admitted on 10/27/2016 with altered mental status.  He was recently transferred to SNF yesterday 11/24 after an inpatient hospitalization at [facility].  Diagnosis #1 acute encephalopathy,  #2 acute on chronic respiratory failure , #3 renal failure . #4 hyperglycemia. Pharmacy has been consulted for IV Vancomycin and Cefepime dosing.  SNF medication administration records indicate that he was on Vancomycin 1.5 g IV q48h with next dose due tomorrow 11/26 @ 10:00 AM.   SCr = 3.09,  Plan: Check a random vancomycin level tonight then order vancomycin dose.  Per SNF MAR he was on Vancomycin 1.5 g IV q48h, next due 10/28/16 @10AM  Give Cefepime 1g IV q24h  Monitor clinical status, renal function, culture results, vancomycin trough level per protocol.    Temp (24hrs), Avg:98.3 F (36.8 C), Min:98.3 F (36.8 C), Max:98.3 F (36.8 C)   Recent Labs Lab 10/27/16 1335  WBC 10.3  CREATININE 3.09*    CrCl cannot be calculated (Unknown ideal weight.).    Allergies  Allergen Reactions  . Penicillins Rash    No other information available at this time    Antimicrobials this admission:  Dose adjustments this admission:   Microbiology results: 11/25 BCx: sent   Thank you for allowing pharmacy to be a part of this patient's care.  Dylan Hubbard, RPh Clinical Pharmacist Pager: [phone] 10/27/2016 7:59 PM

## 2016-10-27 NOTE — ED Notes (Signed)
Per Dr. Shela CommonsJ, PT. Cant go to CT until after the blood gas is performed.  Should be around 2:30

## 2016-10-27 NOTE — ED Notes (Addendum)
Molly - RN aware of pt's pulse.

## 2016-10-27 NOTE — ED Notes (Signed)
Pt is no longer full code, pt's wife stated that pt can be intubated but no compressions

## 2016-10-27 NOTE — H&P (Signed)
PULMONARY / CRITICAL CARE MEDICINE   Name: Dylan Hubbard MRN: [id number] DOB: [birth year]    ADMISSION DATE:  10/27/2016 CONSULTATION DATE:  October 27, 2016  CHIEF COMPLAINT:  Altered mental status  HISTORY OF PRESENT ILLNESS:   [name] is a 65 y/o man with a history of CHF (EF 25-30%), AF on AC, as well as chronic respiratory disease (chart carries dx of COPD) with chronic hypoxic and hypercapnic respiratory failure. [facility] to [facility] on [facility] with progressively worsening dyspnea. He apparently had stopped his medications (which include lasix). He also had AF with RVR, with an attempt at cardioversion on 11/10 which was unsuccessful. He was rate (but not rhythm) controlled with amio and digoxin, both of which appeared to be new medications for him. He developed fever on 11/17, and CXR demonstrated RLL consolidation. He was initially treated with levofloxacin and vanc, but when his blood culture grew staph epi, it appears that he was narrowed to vanc only. He also had a steadily increasing creatinine from baseline <1 to ~3.0 on presentation [facility].  [facility] was [facility] on [facility]/24 (yesterday), and a PICC line was placed on the day of discharge. His wife spoke with him after the procedure, and said he was "out of head" yesterday afternoon because of the sedation he received for the procedure. On the morning of 11/25, he was found to be poorly responsive and EMS was called. Rather than re[facility]present[facility] to [facility], EMS [facility]. He found to be in acute on chronic hypercarbic respiratory failure. He was started on BiPAP, and after a few hours, he did have some improvement in his mental status and blood gas.  PAST MEDICAL HISTORY :  He  has a past medical history of Anxiety; Atrial fibrillation (HCC); CHF (congestive heart failure) (HCC); Coronary artery disease; Diabetes mellitus without complication (HCC); GERD (gastroesophageal reflux disease); High cholesterol;  Hypertension; Pneumonia; Sepsis (HCC); and Sleep apnea.  PAST SURGICAL HISTORY: He  has no past surgical history on file.  Allergies  Allergen Reactions  . Penicillins Rash    No other information available at this time    No current facility-administered medications on file prior to encounter.    No current outpatient prescriptions on file prior to encounter.    FAMILY HISTORY:  His has no family status information on file.    SOCIAL HISTORY: He   is married.  REVIEW OF SYSTEMS:   Unable to obtain due to somnolent state   VITAL SIGNS: BP (!) 126/44   Pulse (!) 43   Temp 98.3 F (36.8 C) (Rectal)   Resp 13   SpO2 99%   HEMODYNAMICS:    VENTILATOR SETTINGS: BiPAP: 18/7    INTAKE / OUTPUT: No intake/output data recorded.  PHYSICAL EXAMINATION: General:  Chronically ill-appearing man, somnolent, lying in stretcher Neuro:  Asleep, but rouses to voice, able to answer questions, but with considerable delay. HEENT:  Mild crusting around the eyes Cardiovascular:  Distant heart sounds, elevated JVP, 3+ LE edema. Lungs:  CTA Abdomen:  Obese Musculoskeletal:  No deformities Skin:  No rashes on visible skin  LABS:  BMET  Recent Labs Lab 10/27/16 1335  NA 137  K 4.8  CL 101  CO2 27  BUN 17  CREATININE 3.09*  GLUCOSE 146*    Electrolytes  Recent Labs Lab 10/27/16 1335  CALCIUM 8.5*    CBC  Recent Labs Lab 10/27/16 1335  WBC 10.3  HGB 12.1*  HCT 38.1*  PLT 277    Coag's  Recent Labs Lab 10/27/16 1716  INR 2.59    Sepsis Markers No results for input(s): LATICACIDVEN, PROCALCITON, O2SATVEN in the last 168 hours.  ABG  Recent Labs Lab 10/27/16 1441 10/27/16 1641  PHART 7.288* 7.314*  PCO2ART 59.8* 56.4*  PO2ART 104.0 81.0*    Liver Enzymes  Recent Labs Lab 10/27/16 1335  AST 17  ALT 14*  ALKPHOS 122  BILITOT 0.5  ALBUMIN 2.2*    Cardiac Enzymes  Recent Labs Lab 10/27/16 1335  TROPONINI 0.19*     Glucose No results for input(s): GLUCAP in the last 168 hours.  Imaging Ct Head Wo Contrast  Result Date: 10/27/2016 CLINICAL DATA:  Altered mental status EXAM: CT HEAD WITHOUT CONTRAST TECHNIQUE: Contiguous axial images were obtained from the base of the skull through the vertex without intravenous contrast. COMPARISON:  None. FINDINGS: Brain: No evidence of acute infarction, hemorrhage, hydrocephalus, extra-axial collection or mass lesion/mass effect. Mild cerebral atrophy. Mild periventricular white matter decreased attenuation probable due to chronic small vessel ischemic changes. Vascular: Atherosclerotic calcifications of carotid siphon are noted. Skull: Normal. Negative for fracture or focal lesion. Sinuses/Orbits: No acute finding. Other: None. IMPRESSION: No acute intracranial abnormality. Mild cerebral atrophy. Mild periventricular white matter decreased attenuation probable due to chronic small vessel ischemic changes. Atherosclerotic calcifications of carotid siphon. Electronically Signed   By: Natasha MeadLiviu  Pop M.D.   On: 10/27/2016 17:08   Dg Chest Portable 1 View  Result Date: 10/27/2016 CLINICAL DATA:  65 year old male with a history of PICC confirmation EXAM: PORTABLE CHEST 1 VIEW COMPARISON:  10/25/2016, 10/21/2016 FINDINGS: Right upper extremity PICC, with the tip appearing to terminate superior cavoatrial junction. Asymmetric mixed interstitial and airspace opacity of the right lung. Blunting of the right costophrenic angle and cardiophrenic angle with hazy lower lung opacity. Left lung relatively well aerated. No pneumothorax. IMPRESSION: Right-sided opacity, likely combination of atelectasis/consolidation, edema, and right-sided pleural fluid. Right upper extremity PICC, with the tip appearing to terminate superior cavoatrial junction. Aortic atherosclerosis. Signed, Yvone NeuJaime S. Loreta AveWagner, DO [facility] Electronically Signed   By:  Gilmer MorJaime  Wagner D.O.   On: 10/27/2016 14:11     STUDIES:  CXR shows mild to moderate alveolar filling pattern in the RLL.  CULTURES: Blood (11/25) x2 >> ordered  ANTIBIOTICS: Vanc started at OSH 11/17 >> Levofloxacin 11/17 >> ?? 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  ASSESSMENT / PLAN:  PULMONARY A: COPD Chronic mixed hypoxic and acute on chronic hypercarbic respiratory failure Pneumonia Possible pulmonary edema P:   Hypercarbia likely brought on by sedation for PICC placement. Continue BiPAP (18/7) for now. Q4 duo-nebs (STOP Albuterol if RVR reoccurs) Treat underlying PNA and volume overload D/w with wife, OK for intubation if chance of providing a meaningful quality of luife  CARDIOVASCULAR A:  CHF AF Dig toxicity Troponinemia P:  Dig is likely providing relative bradycardia compared to necessary HR to provide adequate CO. Hold dig and amio for now. Decompensated HR. 40 mg of IV lasix now, f/u BNP. Chronic AC (coumadin), therapeutic at time of admission (INR of 2.59). Trop leak likely due to demand ischemia compounded by renal failure  RENAL A:   Acute kidney injury P:   Likely  played a causative role in dig toxicity. Suspect cardiorenal syndrome. Will dose with lasix 40 mg x1 and assess response.  GASTROINTESTINAL A:   Hypoalbuminemia Likely malnutrition P:   NPO for now, hold off on NGT placement, but if not able to safely take PO tomorrow, place tube.  HEMATOLOGIC A:   Mild anemia Therapeutic AC with coumadin P:    INFECTIOUS A:   HCAP Positive culture (Staph Epi) in blood P:   More likely to be contaminant than causative organism in pneumonia. Will re-culture, re-broaden to include opportunistic  gram-negatives.  ENDOCRINE A:   DM2 P:   SSI  NEUROLOGIC A:   Metabolic encephalopathy  P:   Tx underlying cause (hypercarbia)  FAMILY  - Updates: Discussed status with wife. She was concerned that he be able to have a chance at a "decent" quality of life. I explained that in the context of reversible conditions (like sedation administration), measures such as ACLS mediations, electrotherapy, and mechanical ventilation would provide a chance of reasonable quality of life, but that CPR for a true cardiac arrest would not likely provide him with a reasonable quality of life. I recommended he be a partial code reflecting these wishes, and she agreed.  - Inter-disciplinary family meet or Palliative Care meeting due by:  12/2  CRITICAL CARE Performed by: Jamie Kato   Total critical care time: 80 minutes  Critical care time was exclusive of separately billable procedures and treating other patients.  Critical care was necessary to treat or prevent imminent or life-threatening deterioration.  Critical care was time spent personally by me on the following activities: development of treatment plan with patient and/or surrogate as well as nursing, discussions with consultants, evaluation of patient's response to treatment, examination of patient, obtaining history from patient or surrogate, ordering and performing treatments and interventions, ordering and review of laboratory studies, ordering and review of radiographic studies, pulse oximetry and re-evaluation of patient's condition.  Jamie Kato, MD Pulmonary and Critical Care Medicine [facility] Pager: [phone]  10/27/2016, 7:47 PM  m

## 2016-10-28 DIAGNOSIS — J9601 Acute respiratory failure with hypoxia: Secondary | ICD-10-CM

## 2016-10-28 DIAGNOSIS — G9340 Encephalopathy, unspecified: Secondary | ICD-10-CM

## 2016-10-28 DIAGNOSIS — N179 Acute kidney failure, unspecified: Secondary | ICD-10-CM

## 2016-10-28 LAB — BLOOD GAS, ARTERIAL
ACID-BASE EXCESS: 3.1 mmol/L — AB (ref 0.0–2.0)
Bicarbonate: 27.6 mmol/L (ref 20.0–28.0)
DELIVERY SYSTEMS: POSITIVE
EXPIRATORY PAP: 6
FIO2: 0.3
INSPIRATORY PAP: 18
MODE: POSITIVE
O2 Saturation: 93.3 %
PCO2 ART: 45.6 mmHg (ref 32.0–48.0)
PH ART: 7.398 (ref 7.350–7.450)
Patient temperature: 98.6
pO2, Arterial: 66.5 mmHg — ABNORMAL LOW (ref 83.0–108.0)

## 2016-10-28 LAB — BASIC METABOLIC PANEL
ANION GAP: 9 (ref 5–15)
BUN: 24 mg/dL — ABNORMAL HIGH (ref 6–20)
CALCIUM: 7.8 mg/dL — AB (ref 8.9–10.3)
CO2: 26 mmol/L (ref 22–32)
Chloride: 95 mmol/L — ABNORMAL LOW (ref 101–111)
Creatinine, Ser: 3.81 mg/dL — ABNORMAL HIGH (ref 0.61–1.24)
GFR, EST AFRICAN AMERICAN: 18 mL/min — AB (ref >60)
GFR, EST NON AFRICAN AMERICAN: 15 mL/min — AB (ref >60)
GLUCOSE: 121 mg/dL — AB (ref 65–99)
POTASSIUM: 3.7 mmol/L (ref 3.5–5.1)
SODIUM: 130 mmol/L — AB (ref 135–145)

## 2016-10-28 LAB — CBC
HCT: 32.5 % — ABNORMAL LOW (ref 39.0–52.0)
Hemoglobin: 10.5 g/dL — ABNORMAL LOW (ref 13.0–17.0)
MCH: 30.2 pg (ref 26.0–34.0)
MCHC: 32.3 g/dL (ref 30.0–36.0)
MCV: 93.4 fL (ref 78.0–100.0)
PLATELETS: 270 10*3/uL (ref 150–400)
RBC: 3.48 MIL/uL — AB (ref 4.22–5.81)
RDW: 14.4 % (ref 11.5–15.5)
WBC: 7.1 10*3/uL (ref 4.0–10.5)

## 2016-10-28 LAB — GLUCOSE, CAPILLARY
GLUCOSE-CAPILLARY: 119 mg/dL — AB (ref 65–99)
GLUCOSE-CAPILLARY: 124 mg/dL — AB (ref 65–99)

## 2016-10-28 LAB — VANCOMYCIN, RANDOM
VANCOMYCIN RM: 32
Vancomycin Rm: 29

## 2016-10-28 LAB — TROPONIN I: Troponin I: 0.14 ng/mL (ref <0.03)

## 2016-10-28 LAB — MAGNESIUM: MAGNESIUM: 2 mg/dL (ref 1.7–2.4)

## 2016-10-28 LAB — BRAIN NATRIURETIC PEPTIDE: B Natriuretic Peptide: 324.4 pg/mL — ABNORMAL HIGH (ref 0.0–100.0)

## 2016-10-28 LAB — PHOSPHORUS: PHOSPHORUS: 3.7 mg/dL (ref 2.5–4.6)

## 2016-10-28 LAB — PROTIME-INR
INR: 2.49
PROTHROMBIN TIME: 27.4 s — AB (ref 11.4–15.2)

## 2016-10-28 LAB — MRSA PCR SCREENING: MRSA BY PCR: NEGATIVE

## 2016-10-28 MED ORDER — FUROSEMIDE 10 MG/ML IJ SOLN
20.0000 mg | Freq: Once | INTRAMUSCULAR | Status: AC
  Administered 2016-10-28: 20 mg via INTRAVENOUS
  Filled 2016-10-28: qty 2

## 2016-10-28 MED ORDER — ORAL CARE MOUTH RINSE
15.0000 mL | Freq: Two times a day (BID) | OROMUCOSAL | Status: DC
  Administered 2016-10-28 – 2016-11-11 (×26): 15 mL via OROMUCOSAL

## 2016-10-28 MED ORDER — ALBUMIN HUMAN 25 % IV SOLN
12.5000 g | Freq: Once | INTRAVENOUS | Status: AC
  Administered 2016-10-28: 12.5 g via INTRAVENOUS
  Filled 2016-10-28: qty 50

## 2016-10-28 MED ORDER — CHLORHEXIDINE GLUCONATE 0.12 % MT SOLN
15.0000 mL | Freq: Two times a day (BID) | OROMUCOSAL | Status: DC
  Administered 2016-10-28 – 2016-11-12 (×30): 15 mL via OROMUCOSAL
  Filled 2016-10-28 (×23): qty 15

## 2016-10-28 MED ORDER — SODIUM CHLORIDE 0.9% FLUSH
10.0000 mL | Freq: Two times a day (BID) | INTRAVENOUS | Status: DC
  Administered 2016-10-28 – 2016-10-31 (×6): 10 mL
  Administered 2016-11-01: 20 mL
  Administered 2016-11-01 – 2016-11-05 (×5): 10 mL

## 2016-10-28 MED ORDER — BUDESONIDE 0.5 MG/2ML IN SUSP
0.5000 mg | Freq: Two times a day (BID) | RESPIRATORY_TRACT | Status: DC
  Administered 2016-10-28 – 2016-11-12 (×31): 0.5 mg via RESPIRATORY_TRACT
  Filled 2016-10-28 (×31): qty 2

## 2016-10-28 MED ORDER — SODIUM CHLORIDE 0.9% FLUSH: 10.0000 mL | INTRAVENOUS | Status: DC | PRN

## 2016-10-28 NOTE — Progress Notes (Signed)
No urine output noted from report.  Dr Christene Slatese Dios notified.  Order for foley to be placed.

## 2016-10-28 NOTE — Progress Notes (Signed)
Pharmacy Antibiotic Note  Dylan Hubbard is a 65 y.o. male admitted on 10/27/2016 with pneumonia.  Pharmacy has been consulted for Vancomycin dosing. Vancomycin random tonight has continued to trend upward from 29 to 32.   Plan: Hold vancomycin Recheck random vancomycin level in 24h  Redose as appropriate   Temp (24hrs), Avg:97.7 F (36.5 C), Min:97.2 F (36.2 C), Max:98.2 F (36.8 C)   Recent Labs Lab 10/27/16 1335 10/27/16 2102 10/27/16 2224 10/28/16 1000 10/28/16 1734  WBC 10.3 9.2  --  7.1  --   CREATININE 3.09* 3.34*  --  3.81*  --   VANCORANDOM  --   --  29  --  32    CrCl cannot be calculated (Unknown ideal weight.).    Allergies  Allergen Reactions  . Penicillins Rash    No other information available at this time    Arlean HoppingCorey M. Newman PiesBall, PharmD, BCPS Clinical Pharmacist Pager [phone] 10/28/2016 6:33 PM

## 2016-10-28 NOTE — Progress Notes (Signed)
ANTICOAGULATION CONSULT NOTE - Initial Consult  Pharmacy Consult for Heparin Indication: atrial fibrillation  Allergies  Allergen Reactions  . Penicillins Rash    No other information available at this time    Patient Measurements: Weight: 180 lb 12.4 oz (82 kg) Heparin Dosing Weight:   Vital Signs: Temp: 98.2 F (36.8 C) (11/26 1200) Temp Source: Oral (11/26 1200) BP: 91/72 (11/26 1400) Pulse Rate: 71 (11/26 1400)  Labs:  Recent Labs  10/27/16 1335 10/27/16 1716 10/27/16 2102 10/28/16 1000 10/28/16 1733  HGB 12.1*  --  11.6* 10.5*  --   HCT 38.1*  --  36.4* 32.5*  --   PLT 277  --  238 270  --   LABPROT  --  28.3*  --   --  27.4*  INR  --  2.59  --   --  2.49  CREATININE 3.09*  --  3.34* 3.81*  --   TROPONINI 0.19*  --   --   --   --     CrCl cannot be calculated (Unknown ideal weight.).   Medical History: Past Medical History:  Diagnosis Date  . Anxiety   . Atrial fibrillation (HCC)   . CHF (congestive heart failure) (HCC)   . Coronary artery disease   . Diabetes mellitus without complication (HCC)   . GERD (gastroesophageal reflux disease)   . High cholesterol   . Hypertension   . Pneumonia   . Sepsis (HCC)   . Sleep apnea     Medications:  Prescriptions Prior to Admission  Medication Sig Dispense Refill Last Dose  . ALPRAZolam (XANAX) 0.5 MG tablet Take 0.5 mg by mouth every 8 (eight) hours as needed for anxiety.   unknown  . amiodarone (PACERONE) 200 MG tablet Take 200 mg by mouth daily.   unknown  . aspirin EC 81 MG tablet Take 81 mg by mouth every evening. 4pm   unknown  . atorvastatin (LIPITOR) 80 MG tablet Take 80 mg by mouth at bedtime.   unknown  . benzonatate (TESSALON) 100 MG capsule Take 100 mg by mouth every 8 (eight) hours as needed for cough.   unknown  . citalopram (CELEXA) 20 MG tablet Take 20 mg by mouth at bedtime.   unknown  . digoxin (LANOXIN) 0.125 MG tablet Take 0.125 mg by mouth every evening. 5pm   unknown  . furosemide  (LASIX) 20 MG tablet Take 20 mg by mouth.   unknown  . insulin regular (NOVOLIN R,HUMULIN R) 250 units/2.565mL (100 units/mL) injection Inject 0-8 Units into the skin 4 (four) times daily -  before meals and at bedtime. Per sliding scale: CBG 0-200 0 units, 201-250 2 units, 251-300 4 units, 301-350 6 units, 351-400 8 units, >400 contact MD   unknown  . ipratropium-albuterol (DUONEB) 0.5-2.5 (3) MG/3ML SOLN Take 3 mLs by nebulization every hour as needed (COPD).   unknown  . metoprolol tartrate (LOPRESSOR) 25 MG tablet Take 12.5 mg by mouth 2 (two) times daily.   unknown  . OXYGEN Inhale 2 L into the lungs continuous.   10/26/2016 at 11p-7a shift  . vancomycin IVPB Inject into the vein See admin instructions. Use 1.5 gram intravenously every 48 hours for Staph Epi Bactermia - start date 10/28/16 @@1000       Scheduled:  . amiodarone  200 mg Oral Daily  . aspirin EC  81 mg Oral QPM  . atorvastatin  80 mg Oral QHS  . budesonide (PULMICORT) nebulizer solution  0.5 mg Nebulization BID  .  ceFEPime (MAXIPIME) IV  1 g Intravenous Q24H  . chlorhexidine  15 mL Mouth Rinse BID  . ipratropium-albuterol  3 mL Nebulization Q4H  . mouth rinse  15 mL Mouth Rinse q12n4p  . sodium chloride flush  10-40 mL Intracatheter Q12H   Infusions:    Assessment: 65yo male with history of Afib on warfarin PTA presents with AMS. Pharmacy is consulted to dose heparin for atrial fibrillation.   Goal of Therapy:  Heparin level 0.3-0.7 units/ml Monitor platelets by anticoagulation protocol: Yes   Plan:  Hold heparin until INR is less than 2.0 Daily INR Monitor s/sx of bleeding  Arlean Hoppingorey M. Newman PiesBall, PharmD, BCPS Clinical Pharmacist Pager [phone] 10/28/2016,3:34 PM

## 2016-10-28 NOTE — Progress Notes (Signed)
PULMONARY / CRITICAL CARE MEDICINE   Name: Dylan Hubbard MRN: [id number] DOB: Apr 27, 1951    ADMISSION DATE:  10/27/2016 CONSULTATION DATE:  October 28, 2016  CHIEF COMPLAINT:  Altered mental status  HISTORY OF PRESENT ILLNESS:   [name] is a 65 y/o man with a history of CHF (EF 25-30%), AF on AC, as well as chronic respiratory disease (chart carries dx of COPD) with chronic hypoxic and hypercapnic respiratory failure. He [facility] on [facility] with progressively worsening dyspnea. He apparently had stopped his medications (which include lasix). He also had AF with RVR, with an attempt at cardioversion on 11/10 which was unsuccessful. He was rate (but not rhythm) controlled with amio and digoxin, both of which appeared to be new medications for him. He developed fever on 11/17, and CXR demonstrated RLL consolidation. He was initially treated with levofloxacin and vanc, but when his blood culture grew staph epi, it appears that he was narrowed to vanc only. He also had a steadily increasing creatinine from baseline <1 to ~3.0 [facility].  [facility] on [facility] (yesterday), and a PICC line was placed on the day of discharge. His wife spoke with him after the procedure, and said he was "out of head" yesterday afternoon because of the sedation he received for the procedure. On the morning of 11/25, he was found to be poorly responsive and EMS was called. [facility] He found to be in acute on chronic hypercarbic respiratory failure. He was started on BiPAP, and after a few hours, he did have some improvement in his mental status and blood gas.  INTERVAL HISTORY : More awake. Comfortable on bipap. Dec in uo   VITAL SIGNS: BP 91/72   Pulse 71   Temp 98.2 F (36.8 C) (Oral)   Resp 20   Wt 82 kg (180 lb 12.4 oz)   SpO2 97%   HEMODYNAMICS:    VENTILATOR SETTINGS: BiPAP: 18/7 FiO2 (%):  [30  %] 30 %  INTAKE / OUTPUT: I/O last 3 completed shifts: In: 200 [I.V.:150; IV Piggyback:50] Out: -   PHYSICAL EXAMINATION: General:  Chronically ill-appearing man, somnolent, lying in stretcher Neuro:  Asleep, but rouses to voice, able to answer questions, but with considerable delay. HEENT:  Mild crusting around the eyes Cardiovascular:  Distant heart sounds, elevated JVP, 3+ LE edema. Lungs:  CTA Abdomen:  Obese Musculoskeletal:  No deformities Skin:  No rashes on visible skin  LABS:  BMET  Recent Labs Lab 10/27/16 1335 10/27/16 2102 10/28/16 1000  NA 137  --  130*  K 4.8  --  3.7  CL 101  --  95*  CO2 27  --  26  BUN 17  --  24*  CREATININE 3.09* 3.34* 3.81*  GLUCOSE 146*  --  121*    Electrolytes  Recent Labs Lab 10/27/16 1335 10/28/16 1000  CALCIUM 8.5* 7.8*  MG  --  2.0  PHOS  --  3.7    CBC  Recent Labs Lab 10/27/16 1335 10/27/16 2102 10/28/16 1000  WBC 10.3 9.2 7.1  HGB 12.1* 11.6* 10.5*  HCT 38.1* 36.4* 32.5*  PLT 277 238 270    Coag's  Recent Labs Lab 10/27/16 1716  INR 2.59    Sepsis Markers No results for input(s): LATICACIDVEN, PROCALCITON, O2SATVEN in the last 168 hours.  ABG  Recent Labs Lab 10/27/16 1441 10/27/16 1641 10/28/16 1000  PHART  7.288* 7.314* 7.398  PCO2ART 59.8* 56.4* 45.6  PO2ART 104.0 81.0* 66.5*    Liver Enzymes  Recent Labs Lab 10/27/16 1335  AST 17  ALT 14*  ALKPHOS 122  BILITOT 0.5  ALBUMIN 2.2*    Cardiac Enzymes  Recent Labs Lab 10/27/16 1335  TROPONINI 0.19*    Glucose  Recent Labs Lab 10/27/16 2153 10/28/16 1301  GLUCAP 131* 119*    Imaging Ct Head Wo Contrast  Result Date: 10/27/2016 CLINICAL DATA:  Altered mental status EXAM: CT HEAD WITHOUT CONTRAST TECHNIQUE: Contiguous axial images were obtained from the base of the skull through the vertex without intravenous contrast. COMPARISON:  None. FINDINGS: Brain: No evidence of acute infarction, hemorrhage,  hydrocephalus, extra-axial collection or mass lesion/mass effect. Mild cerebral atrophy. Mild periventricular white matter decreased attenuation probable due to chronic small vessel ischemic changes. Vascular: Atherosclerotic calcifications of carotid siphon are noted. Skull: Normal. Negative for fracture or focal lesion. Sinuses/Orbits: No acute finding. Other: None. IMPRESSION: No acute intracranial abnormality. Mild cerebral atrophy. Mild periventricular white matter decreased attenuation probable due to chronic small vessel ischemic changes. Atherosclerotic calcifications of carotid siphon. Electronically Signed   By: Natasha MeadLiviu  Pop M.D.   On: 10/27/2016 17:08   Dg Chest Port 1 View  Result Date: 10/28/2016 CLINICAL DATA:  65 y/o  M; respiratory distress. EXAM: PORTABLE CHEST 1 VIEW COMPARISON:  10/27/2016 chest radiograph. FINDINGS: Stable cardiomediastinal silhouette. Right PICC line tip projects over cavoatrial junction. Ill-defined right basilar opacity probably represents effusion atelectasis. Underlying pneumonia is not excluded. No significant interval change. Bones are unremarkable. Pulmonary venous hypertension. IMPRESSION: Stable right basilar opacity probably representing effusion and atelectasis. Pneumonia is not excluded. No new airspace disease identified. Pulmonary venous hypertension. Electronically Signed   By: Mitzi HansenLance  Furusawa-Stratton M.D.   On: 10/28/2016 00:07     STUDIES:  CXR shows mild to moderate alveolar filling pattern in the RLL.  CULTURES: Blood (11/25) x2 >> ordered  ANTIBIOTICS: Vanc started at OSH 11/17 >> Levofloxacin 11/17 >> ?? 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  ASSESSMENT / PLAN:  PULMONARY A: COPD Chronic  mixed hypoxic and acute on chronic hypercarbic respiratory failure Pneumonia Possible pulmonary edema P:   Hypercarbia likely brought on by sedation for PICC placement. ABG better but pt still drowsy.  Continue BiPAP (18/7) for now. Q4 duo-nebs (STOP Albuterol if RVR reoccurs). Start pulmicort neb Treat underlying PNA and volume overload D/w with wife, OK for intubation if chance of providing a meaningful quality of luife  CARDIOVASCULAR A:  CHF AF Dig toxicity Troponinemia P:  Dig is likely providing relative bradycardia compared to necessary HR to provide adequate CO. Hold dig and amio for now. Decompensated HR. 40 mg of IV given earlier. Dec in uo. Will place foley > if still low uo, will try albumin and lasix 20 mg IV x 1 dose  Chronic AC (coumadin), therapeutic at time of admission (INR of 2.59). Heparin pharmacy consult Trop leak likely due to demand ischemia compounded by renal failure. Will cycle.   RENAL A:   Acute kidney injury P:   Likely played a causative role in dig toxicity. Suspect cardiorenal syndrome. Got lasix earlier >  wil place foley > if no uo, try albumin and lasix combo (pt  likely 3rd spacing) x 1  GASTROINTESTINAL A:   Hypoalbuminemia Likely malnutrition P:   NPO for now, hold off on NGT placement, but if not able to safely take PO tomorrow, place coretrack.   HEMATOLOGIC A:   Mild anemia Therapeutic AC with coumadin P:  Heparin per pharmacy  INFECTIOUS A:   HCAP Positive culture (Staph Epi) in blood P:   More likely to be contaminant than causative organism in pneumonia. Will re-culture, re-broaden to include opportunistic gram-negatives.  ENDOCRINE A:   DM2 P:   SSI  NEUROLOGIC A:   Metabolic encephalopathy  P:   Tx underlying cause (hypercarbia)  FAMILY  - Updates: Discussed status with wife on 11/25.  She was concerned that he be able to have a chance at a "decent" quality of life. I explained that in the context of  reversible conditions (like sedation administration), measures such as ACLS mediations, electrotherapy, and mechanical ventilation would provide a chance of reasonable quality of life, but that CPR for a true cardiac arrest would not likely provide him with a reasonable quality of life. I recommended he be a partial code reflecting these wishes, and she agreed.  - Inter-disciplinary family meet or Palliative Care meeting due by:  12/2  Critical care time with this pt today : 30 minutes.   Pollie Meyer, MD 10/28/2016, 3:30 PM [facility] Pager [phone] After 3 pm or if no answer, call [phone]

## 2016-10-28 NOTE — Progress Notes (Signed)
On 10/29/2016 at 23:20 it was noted that the pt had absent breath sounds on the left side by this RT. Pt's RN and another RT confirmed. I called the box and a stat chest x-ray was ordered. No further orders have been given. Upon reassessment of the pt at this time there are still absent breath sounds on the left side. RT will continue to monitor.

## 2016-10-28 NOTE — Progress Notes (Signed)
Called Elink and CCM about Patient's heart rate and they ordered stat labs. Will continue to monitor.

## 2016-10-28 NOTE — Progress Notes (Signed)
Pharmacy Antibiotic Note  Van ClinesDouglas E Salvas is a 65 y.o. male admitted on 10/27/2016 with pneumonia.  Pharmacy has been consulted for Vancomycin dosing. Vancomycin random tonight is 29. Some confusion about timing of levels at outside hospital and dosing regimen.   Plan: -Check random vancomycin level at 1800 today -Re-dose as appropriate   Temp (24hrs), Avg:97.6 F (36.4 C), Min:97.2 F (36.2 C), Max:98.3 F (36.8 C)   Recent Labs Lab 10/27/16 1335 10/27/16 2102 10/27/16 2224  WBC 10.3 9.2  --   CREATININE 3.09* 3.34*  --   VANCORANDOM  --   --  29    CrCl cannot be calculated (Unknown ideal weight.).    Allergies  Allergen Reactions  . Penicillins Rash    No other information available at this time     Abran DukeLedford, Karrisa Didio 10/28/2016 2:28 AM

## 2016-10-29 ENCOUNTER — Inpatient Hospital Stay (HOSPITAL_COMMUNITY): Payer: Medicare Other

## 2016-10-29 DIAGNOSIS — R0600 Dyspnea, unspecified: Secondary | ICD-10-CM

## 2016-10-29 LAB — ECHOCARDIOGRAM COMPLETE
CHL CUP DOP CALC LVOT VTI: 15.4 cm
E decel time: 134 msec
E/e' ratio: 7.38
FS: 15 % — AB (ref 28–44)
IVS/LV PW RATIO, ED: 0.95
LA ID, A-P, ES: 39 mm
LA vol A4C: 67 ml
LA vol: 51.1 mL
LDCA: 3.14 cm2
LEFT ATRIUM END SYS DIAM: 39 mm
LV E/e' medial: 7.38
LV PW d: 12.3 mm — AB (ref 0.6–1.1)
LV TDI E'MEDIAL: 7.99
LVEEAVG: 7.38
LVELAT: 14.1 cm/s
LVOT SV: 48 mL
LVOTD: 20 mm
LVOTPV: 109 cm/s
MV Dec: 134
MV Peak grad: 4 mmHg
MVPKEVEL: 104 m/s
TDI e' lateral: 14.1
Weight: 2892.44 oz

## 2016-10-29 LAB — GLUCOSE, CAPILLARY
GLUCOSE-CAPILLARY: 140 mg/dL — AB (ref 65–99)
GLUCOSE-CAPILLARY: 114 mg/dL — AB (ref 65–99)
Glucose-Capillary: 127 mg/dL — ABNORMAL HIGH (ref 65–99)
Glucose-Capillary: 111 mg/dL — ABNORMAL HIGH (ref 65–99)
Glucose-Capillary: 119 mg/dL — ABNORMAL HIGH (ref 65–99)

## 2016-10-29 LAB — TROPONIN I
TROPONIN I: 0.15 ng/mL — AB (ref <0.03)
Troponin I: 0.17 ng/mL (ref <0.03)

## 2016-10-29 LAB — BASIC METABOLIC PANEL
ANION GAP: 10 (ref 5–15)
BUN: 34 mg/dL — ABNORMAL HIGH (ref 6–20)
CALCIUM: 8.3 mg/dL — AB (ref 8.9–10.3)
CO2: 25 mmol/L (ref 22–32)
Chloride: 103 mmol/L (ref 101–111)
Creatinine, Ser: 4.38 mg/dL — ABNORMAL HIGH (ref 0.61–1.24)
GFR, EST AFRICAN AMERICAN: 15 mL/min — AB (ref >60)
GFR, EST NON AFRICAN AMERICAN: 13 mL/min — AB (ref >60)
GLUCOSE: 114 mg/dL — AB (ref 65–99)
POTASSIUM: 3.9 mmol/L (ref 3.5–5.1)
SODIUM: 138 mmol/L (ref 135–145)

## 2016-10-29 LAB — VANCOMYCIN, RANDOM: VANCOMYCIN RM: 28

## 2016-10-29 LAB — PROTIME-INR
INR: 2.46
PROTHROMBIN TIME: 27.1 s — AB (ref 11.4–15.2)

## 2016-10-29 MED ORDER — PERFLUTREN LIPID MICROSPHERE
1.0000 mL | INTRAVENOUS | Status: AC | PRN
  Administered 2016-10-29: 2 mL via INTRAVENOUS
  Filled 2016-10-29: qty 10

## 2016-10-29 MED ORDER — FUROSEMIDE 10 MG/ML IJ SOLN
80.0000 mg | Freq: Once | INTRAMUSCULAR | Status: AC
  Administered 2016-10-29: 80 mg via INTRAVENOUS
  Filled 2016-10-29: qty 8

## 2016-10-29 NOTE — Significant Event (Signed)
Requested by patient's primary RN Cristal Deerhristopher to place small bored feeding tube this evening. RN placed via right nare, patient tolerated procedure-no complications during placement. Tube is marked at 109cm at the nare-secured with pink tapes. KUB ordered. Once tip is confirm by KUB, staff may use feeding tube.

## 2016-10-29 NOTE — Progress Notes (Signed)
PULMONARY / CRITICAL CARE MEDICINE   Name: Dylan Hubbard MRN: [id number] DOB: 03-10-1951    ADMISSION DATE:  10/27/2016 CONSULTATION DATE:  October 29, 2016  CHIEF COMPLAINT:  Altered mental status  HISTORY OF PRESENT ILLNESS:   [name] is a 65 y/o man with a history of CHF (EF 25-30%), AF on AC, as well as chronic respiratory disease (chart carries dx of COPD) with chronic hypoxic and hypercapnic respiratory failure. [facility] on [facility] with progressively worsening dyspnea. He apparently had stopped his medications (which include lasix). He also had AF with RVR, with an attempt at cardioversion on 11/10 which was unsuccessful. He was rate (but not rhythm) controlled with amio and digoxin, both of which appeared to be new medications for him. He developed fever on 11/17, and CXR demonstrated RLL consolidation. He was initially treated with levofloxacin and vanc, but when his blood culture grew staph epi, it appears that he was narrowed to vanc only. He also had a steadily increasing creatinine from baseline <1 to ~3.0 [facility].  [facility] was discharged from [facility] on 11/24 (yesterday), and a PICC line was placed on the day of discharge. His wife spoke with him after the procedure, and said he was "out of head" yesterday afternoon because of the sedation he received for the procedure. On the morning of 11/25, he was found to be poorly responsive and EMS was called. [facility]. He found to be in acute on chronic hypercarbic respiratory failure. He was started on BiPAP, and after a few hours, he did have some improvement in his mental status and blood gas.  INTERVAL HISTORY:  Pt remains on BiPAP 18/6.  RN reports pt has not been off since arrival.  Pt denies acute complaints.   VITAL SIGNS: BP (!) 136/41   Pulse (!) 47   Temp (!) 100.4 F (38 C) (Axillary)   Resp 20   Wt 180 lb 12.4 oz (82 kg)   SpO2 96%    HEMODYNAMICS:    VENTILATOR SETTINGS: BiPAP: 18/7 FiO2 (%):  [30 %] 30 %  INTAKE / OUTPUT: I/O last 3 completed shifts: In: 490 [I.V.:340; IV Piggyback:150] Out: 355 [Urine:355]  PHYSICAL EXAMINATION: General:  Chronically ill-appearing man, in NAD Neuro: awake, alert, slow to respond but appropriate  HEENT:  MM pink/dry, BiPAP in place  Cardiovascular:  Distant heart sounds, elevated JVP, 2+ LE edema. Lungs:  Even/non-labored, lungs bilaterally CTA Abdomen:  Obese Musculoskeletal:  No deformities Skin:  No rashes on visible skin  LABS:  BMET  Recent Labs Lab 10/27/16 1335 10/27/16 2102 10/28/16 1000  NA 137  --  130*  K 4.8  --  3.7  CL 101  --  95*  CO2 27  --  26  BUN 17  --  24*  CREATININE 3.09* 3.34* 3.81*  GLUCOSE 146*  --  121*    Electrolytes  Recent Labs Lab 10/27/16 1335 10/28/16 1000  CALCIUM 8.5* 7.8*  MG  --  2.0  PHOS  --  3.7    CBC  Recent Labs Lab 10/27/16 1335 10/27/16 2102 10/28/16 1000  WBC 10.3 9.2 7.1  HGB 12.1* 11.6* 10.5*  HCT 38.1* 36.4* 32.5*  PLT 277 238 270    Coag's  Recent Labs Lab 10/27/16 1716 10/28/16 1733 10/29/16 0529  INR 2.59 2.49 2.46    Sepsis Markers No results for input(s): LATICACIDVEN, PROCALCITON, O2SATVEN in the  last 168 hours.  ABG  Recent Labs Lab 10/27/16 1441 10/27/16 1641 10/28/16 1000  PHART 7.288* 7.314* 7.398  PCO2ART 59.8* 56.4* 45.6  PO2ART 104.0 81.0* 66.5*    Liver Enzymes  Recent Labs Lab 10/27/16 1335  AST 17  ALT 14*  ALKPHOS 122  BILITOT 0.5  ALBUMIN 2.2*    Cardiac Enzymes  Recent Labs Lab 10/28/16 1733 10/28/16 2311 10/29/16 0529  TROPONINI 0.14* 0.15* 0.17*    Glucose  Recent Labs Lab 10/27/16 2153 10/28/16 1301 10/28/16 1628 10/29/16 0833  GLUCAP 131* 119* 124* 140*    Imaging No results found.   STUDIES:  ECHO 11/27 >>   CULTURES: Blood (11/25) x2 >>    ANTIBIOTICS: Vanc started at OSH 11/17 >> Levofloxacin 11/17  >> ?? 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  Recent concerns for staph epi bacteremia.    ASSESSMENT / PLAN:  PULMONARY A: COPD Chronic mixed hypoxic and acute on chronic hypercarbic respiratory failure Pneumonia Possible pulmonary edema P:   Continue BiPAP > cycle on / off Q4 duo-nebs (STOP Albuterol if RVR reoccurs).  Continue Pulmicort neb Treat underlying PNA and volume overload Intermittent CXR  Hopeful to avoid intubation  CARDIOVASCULAR A:  CHF with acute decompensation  AF Troponin Leak - likely demand ischemia Digoxin toxicity Troponinemia P:  Hold Digoxin for now Continue amiodarone  Heparin per pharmacy  Tele monitoring    RENAL A:   Acute kidney injury - likely played a role in digoxin toxicity  P:   Suspect cardiorenal syndrome. Continue foley, monitor UOP  GASTROINTESTINAL A:   Hypoalbuminemia Likely malnutrition P:   NPO for now If remains off bipap, consider oral diet  If unable to remain off bipap, place coretrack 11/28  HEMATOLOGIC A:   Mild anemia Therapeutic AC with coumadin P:  Heparin per pharmacy Monitor CBC   INFECTIOUS A:   HCAP Positive culture (Staph Epi) in blood - 1/2 cultures, likely contaminant  P:   Monitor cultures as above ABX as above, narrow as above  Monitor fever curve / WBC  ENDOCRINE A:   DM2 P:   SSI  NEUROLOGIC A:   Metabolic encephalopathy - resolved P:   Tx underlying cause (hypercarbia) Supportive care  FAMILY  - Updates: Discussed status with wife on 11/25.  She was concerned that he be able to have a chance at a "decent" quality of life. I explained that in the context of reversible conditions (like sedation administration), measures such  as ACLS mediations, electrotherapy, and mechanical ventilation would provide a chance of reasonable quality of life, but that CPR for a true cardiac arrest would not likely provide him with a reasonable quality of life. Recommended he be a partial code reflecting these wishes, and she agreed.  No family available am 11/27.   - Inter-disciplinary family meet or Palliative Care meeting due by:  12/2  CC Time:  30 minutes.   Canary BrimBrandi Ollis, NP-C [facility] Pgr: [phone] or if no answer [phone] 10/29/2016, 1:13 PM  Attending Note:  I have examined patient, reviewed labs, studies and notes. I have discussed the case with B Ollis, and I agree with the data and plans as amended above. 65  yo man with COPD, dCHF, CKD, fecently at [facility] with resp `failure, volume overload, A Fib + RVR. Developed RLL HCAP. Was discharged 11/24 but was quickly readmitted with multisystem failure - encephalopathy, combined hypoxic and hypercapneic resp failure, acute on chronic renal failure. He was treated w BiPAP with some stabilization. On my eval he is on BiPAP, appears weak, can barely speak. He has coarse breath sounds. Will continue his abx, heparin, amiodarone. Will try to give him breaks from BiPAP if he can tolerate. Will need to further address goals of care.  Independent critical care time is 33 minutes.   Levy Pupa, MD, PhD 10/29/2016, 8:11 PM [facility] [phone] or if no answer [phone]

## 2016-10-29 NOTE — Progress Notes (Signed)
Called MD Tyson AliasFeinstein and notified of patients decreased urine output over night. Bladder scan showed minimal amount of urine. Orders received. Will continue to monitor patient.  Horton ChinMacKayla A Amardeep Beckers, RN

## 2016-10-29 NOTE — Progress Notes (Signed)
  Echocardiogram 2D Echocardiogram with Definity has been performed.  Dylan Hubbard, Dylan Hubbard 10/29/2016, 12:48 PM

## 2016-10-29 NOTE — Progress Notes (Signed)
Pharmacy Antibiotic Note  Dylan Hubbard is a 65 y.o. male admitted on 10/27/2016 with pneumonia.  Pharmacy has been consulted for Vancomycin dosing. Vancomycin random tonight has remained supratherapeutic.    Plan: Hold vancomycin Recheck random vancomycin level in 24h  Redose as appropriate   Temp (24hrs), Avg:99 F (37.2 C), Min:98.5 F (36.9 C), Max:100.4 F (38 C)   Recent Labs Lab 10/27/16 1335 10/27/16 2102  10/28/16 1000 10/28/16 1734 10/29/16 1819  WBC 10.3 9.2  --  7.1  --   --   CREATININE 3.09* 3.34*  --  3.81*  --  4.38*  VANCORANDOM  --   --   < >  --  32 28  < > = values in this interval not displayed.  CrCl cannot be calculated (Unknown ideal weight.).    Allergies  Allergen Reactions  . Penicillins Rash    No other information available at this time   Ruben Imony Armentha Branagan, PharmD Clinical Pharmacist Pager: [phone] 10/29/2016 8:02 PM

## 2016-10-29 NOTE — Progress Notes (Signed)
eLink Physician-Brief Progress Note Patient Name: Dylan Hubbard DOB: May 16, 1951 MRN: [id number]   Date of Service  10/29/2016  HPI/Events of Note  Low output  eICU Interventions  Lasix 80 Given 20- earlier, likley too low with crt age He has edema on exam Plan prior was lasix also from bedside mD     Intervention Category Intermediate Interventions: Oliguria - evaluation and management  [name] J. 10/29/2016, 1:17 AM

## 2016-10-29 NOTE — Progress Notes (Signed)
ANTICOAGULATION CONSULT NOTE  Pharmacy Consult for Heparin Indication: atrial fibrillation  Allergies  Allergen Reactions  . Penicillins Rash    No other information available at this time    Patient Measurements: Weight: 180 lb 12.4 oz (82 kg) Heparin Dosing Weight:   Vital Signs: Temp: 100.4 F (38 C) (11/27 0836) Temp Source: Axillary (11/27 0836) BP: 124/42 (11/27 0730) Pulse Rate: 51 (11/27 0730)  Labs:  Recent Labs  10/27/16 1335 10/27/16 1716 10/27/16 2102 10/28/16 1000 10/28/16 1733 10/28/16 2311 10/29/16 0529  HGB 12.1*  --  11.6* 10.5*  --   --   --   HCT 38.1*  --  36.4* 32.5*  --   --   --   PLT 277  --  238 270  --   --   --   LABPROT  --  28.3*  --   --  27.4*  --  27.1*  INR  --  2.59  --   --  2.49  --  2.46  CREATININE 3.09*  --  3.34* 3.81*  --   --   --   TROPONINI 0.19*  --   --   --  0.14* 0.15* 0.17*    CrCl cannot be calculated (Unknown ideal weight.).   Medical History: Past Medical History:  Diagnosis Date  . Anxiety   . Atrial fibrillation (HCC)   . CHF (congestive heart failure) (HCC)   . Coronary artery disease   . Diabetes mellitus without complication (HCC)   . GERD (gastroesophageal reflux disease)   . High cholesterol   . Hypertension   . Pneumonia   . Sepsis (HCC)   . Sleep apnea     Medications:  Prescriptions Prior to Admission  Medication Sig Dispense Refill Last Dose  . ALPRAZolam (XANAX) 0.5 MG tablet Take 0.5 mg by mouth every 8 (eight) hours as needed for anxiety.   unknown  . amiodarone (PACERONE) 200 MG tablet Take 200 mg by mouth daily.   unknown  . aspirin EC 81 MG tablet Take 81 mg by mouth every evening. 4pm   unknown  . atorvastatin (LIPITOR) 80 MG tablet Take 80 mg by mouth at bedtime.   unknown  . benzonatate (TESSALON) 100 MG capsule Take 100 mg by mouth every 8 (eight) hours as needed for cough.   unknown  . citalopram (CELEXA) 20 MG tablet Take 20 mg by mouth at bedtime.   unknown  . digoxin  (LANOXIN) 0.125 MG tablet Take 0.125 mg by mouth every evening. 5pm   unknown  . furosemide (LASIX) 20 MG tablet Take 20 mg by mouth.   unknown  . insulin regular (NOVOLIN R,HUMULIN R) 250 units/2.355mL (100 units/mL) injection Inject 0-8 Units into the skin 4 (four) times daily -  before meals and at bedtime. Per sliding scale: CBG 0-200 0 units, 201-250 2 units, 251-300 4 units, 301-350 6 units, 351-400 8 units, >400 contact MD   unknown  . ipratropium-albuterol (DUONEB) 0.5-2.5 (3) MG/3ML SOLN Take 3 mLs by nebulization every hour as needed (COPD).   unknown  . metoprolol tartrate (LOPRESSOR) 25 MG tablet Take 12.5 mg by mouth 2 (two) times daily.   unknown  . OXYGEN Inhale 2 L into the lungs continuous.   10/26/2016 at 11p-7a shift  . vancomycin IVPB Inject into the vein See admin instructions. Use 1.5 gram intravenously every 48 hours for Staph Epi Bactermia - start date 10/28/16 @@1000       Scheduled:  . amiodarone  200 mg Oral Daily  . aspirin EC  81 mg Oral QPM  . atorvastatin  80 mg Oral QHS  . budesonide (PULMICORT) nebulizer solution  0.5 mg Nebulization BID  . ceFEPime (MAXIPIME) IV  1 g Intravenous Q24H  . chlorhexidine  15 mL Mouth Rinse BID  . ipratropium-albuterol  3 mL Nebulization Q4H  . mouth rinse  15 mL Mouth Rinse q12n4p  . sodium chloride flush  10-40 mL Intracatheter Q12H    Assessment: 65yo male with history of Afib on warfarin PTA presents with AMS. Pharmacy is consulted to dose heparin for atrial fibrillation to start when INR<2. INR stable 2.46 this AM. Noted renal dysfunction.   Goal of Therapy:  Heparin level 0.3-0.7 units/ml Monitor platelets by anticoagulation protocol: Yes   Plan:  Start heparin when INR<2 Daily INR Monitor s/sx of bleeding   Babs BertinHaley Babe Clenney, PharmD, BCPS Clinical Pharmacist 10/29/2016 9:46 AM

## 2016-10-30 ENCOUNTER — Inpatient Hospital Stay (HOSPITAL_COMMUNITY): Payer: Medicare Other

## 2016-10-30 DIAGNOSIS — R609 Edema, unspecified: Secondary | ICD-10-CM

## 2016-10-30 LAB — CBC
HCT: 30.7 % — ABNORMAL LOW (ref 39.0–52.0)
HEMOGLOBIN: 9.9 g/dL — AB (ref 13.0–17.0)
MCH: 30.1 pg (ref 26.0–34.0)
MCHC: 32.2 g/dL (ref 30.0–36.0)
MCV: 93.3 fL (ref 78.0–100.0)
PLATELETS: 274 10*3/uL (ref 150–400)
RBC: 3.29 MIL/uL — ABNORMAL LOW (ref 4.22–5.81)
RDW: 15.1 % (ref 11.5–15.5)
WBC: 5.8 10*3/uL (ref 4.0–10.5)

## 2016-10-30 LAB — GLUCOSE, CAPILLARY
GLUCOSE-CAPILLARY: 100 mg/dL — AB (ref 65–99)
GLUCOSE-CAPILLARY: 116 mg/dL — AB (ref 65–99)
GLUCOSE-CAPILLARY: 131 mg/dL — AB (ref 65–99)
GLUCOSE-CAPILLARY: 158 mg/dL — AB (ref 65–99)
Glucose-Capillary: 104 mg/dL — ABNORMAL HIGH (ref 65–99)
Glucose-Capillary: 107 mg/dL — ABNORMAL HIGH (ref 65–99)

## 2016-10-30 LAB — VANCOMYCIN, RANDOM: VANCOMYCIN RM: 24

## 2016-10-30 LAB — BASIC METABOLIC PANEL
ANION GAP: 8 (ref 5–15)
BUN: 33 mg/dL — ABNORMAL HIGH (ref 6–20)
CALCIUM: 8.3 mg/dL — AB (ref 8.9–10.3)
CO2: 28 mmol/L (ref 22–32)
CREATININE: 4.19 mg/dL — AB (ref 0.61–1.24)
Chloride: 104 mmol/L (ref 101–111)
GFR calc non Af Amer: 14 mL/min — ABNORMAL LOW (ref >60)
GFR, EST AFRICAN AMERICAN: 16 mL/min — AB (ref >60)
GLUCOSE: 102 mg/dL — AB (ref 65–99)
Potassium: 3.6 mmol/L (ref 3.5–5.1)
Sodium: 140 mmol/L (ref 135–145)

## 2016-10-30 LAB — PROTIME-INR
INR: 2.34
PROTHROMBIN TIME: 26.1 s — AB (ref 11.4–15.2)

## 2016-10-30 LAB — MAGNESIUM
MAGNESIUM: 1.9 mg/dL (ref 1.7–2.4)
MAGNESIUM: 1.9 mg/dL (ref 1.7–2.4)

## 2016-10-30 LAB — PHOSPHORUS
PHOSPHORUS: 4 mg/dL (ref 2.5–4.6)
PHOSPHORUS: 3.6 mg/dL (ref 2.5–4.6)

## 2016-10-30 MED ORDER — ASPIRIN 81 MG PO CHEW
81.0000 mg | CHEWABLE_TABLET | Freq: Every evening | ORAL | Status: DC
  Administered 2016-10-30 – 2016-11-12 (×14): 81 mg
  Filled 2016-10-30 (×16): qty 1

## 2016-10-30 MED ORDER — PRO-STAT SUGAR FREE PO LIQD
30.0000 mL | Freq: Two times a day (BID) | ORAL | Status: DC
  Administered 2016-10-30: 30 mL
  Filled 2016-10-30: qty 30

## 2016-10-30 MED ORDER — SODIUM CHLORIDE 0.9 % IV BOLUS (SEPSIS)
250.0000 mL | Freq: Once | INTRAVENOUS | Status: AC
  Administered 2016-10-30: 250 mL via INTRAVENOUS

## 2016-10-30 MED ORDER — VITAL AF 1.2 CAL PO LIQD
1000.0000 mL | ORAL | Status: DC
Start: 2016-10-30 — End: 2016-11-04
  Administered 2016-10-31 – 2016-11-04 (×6): 1000 mL
  Filled 2016-10-30 (×10): qty 1000

## 2016-10-30 MED ORDER — VITAL HIGH PROTEIN PO LIQD
1000.0000 mL | ORAL | Status: DC
  Administered 2016-10-30 (×3)
  Administered 2016-10-30: 20 mL

## 2016-10-30 NOTE — Progress Notes (Signed)
PULMONARY / CRITICAL CARE MEDICINE   Name: Dylan Hubbard MRN: [id number] DOB: 1951/05/08    ADMISSION DATE:  10/27/2016 CONSULTATION DATE:  October 30, 2016  CHIEF COMPLAINT:  Altered mental status  SUMMARY:   65 y/o man with a history of CHF (EF 25-30%), AF on AC, as well as chronic respiratory disease (chart carries dx of COPD) with chronic hypoxic and hypercapnic respiratory failure. He presented to [facility] on 11/6 with progressively worsening dyspnea. He apparently had stopped his medications (which include lasix). He also had AF with RVR, with an attempt at cardioversion on 11/10 which was unsuccessful. He was rate (but not rhythm) controlled with amio and digoxin, both of which appeared to be new medications for him. He developed fever on 11/17, and CXR demonstrated RLL consolidation. He was initially treated with levofloxacin and vanc, but when his blood culture grew staph epi, it appears that he was narrowed to vanc only. He also had a steadily increasing creatinine from baseline <1 to ~3.0 on presentation [facility].  He was discharged from [facility] on 11/24, and a PICC line was placed on the day of discharge. His wife spoke with him after the procedure, and said he was "out of head" yesterday afternoon because of the sedation he received for the procedure. On the morning of 11/25, he was found to be poorly responsive and EMS was called. [facility], [facility]. He found to be in acute on chronic hypercarbic respiratory failure. He was started on BiPAP, and after a few hours, he did have some improvement in his mental status and blood gas.  INTERVAL HISTORY:   RN reports pt off bipap this am. No acute events overnight.  Tmax 99.5, VSS.   VITAL SIGNS: BP 94/65   Pulse 96   Temp 99.5 F (37.5 C) (Oral)   Resp (!) 22   Wt 178 lb 12.7 oz (81.1 kg)   SpO2 95%   HEMODYNAMICS:    VENTILATOR SETTINGS: BiPAP: 18/7 FiO2 (%):  [30 %] 30  %  INTAKE / OUTPUT: I/O last 3 completed shifts: In: 600 [I.V.:360; NG/GT:90; IV Piggyback:150] Out: 1706 [Urine:1705; Stool:1]  PHYSICAL EXAMINATION: General:  Chronically ill-appearing man, in NAD Neuro: awake, alert, slow to respond, oriented to self, follows commands  HEENT:  MM pink/dry  Cardiovascular:  Distant heart sounds, elevated JVP, 2+ LE edema. Lungs:  Even/non-labored, lungs bilaterally CTA Abdomen:  Obese Musculoskeletal:  No deformities Skin:  No rashes on visible skin  LABS:  BMET  Recent Labs Lab 10/28/16 1000 10/29/16 1819 10/30/16 0411  NA 130* 138 140  K 3.7 3.9 3.6  CL 95* 103 104  CO2 26 25 28   BUN 24* 34* 33*  CREATININE 3.81* 4.38* 4.19*  GLUCOSE 121* 114* 102*    Electrolytes  Recent Labs Lab 10/28/16 1000 10/29/16 1819 10/30/16 0411  CALCIUM 7.8* 8.3* 8.3*  MG 2.0  --   --   PHOS 3.7  --   --     CBC  Recent Labs Lab 10/27/16 2102 10/28/16 1000 10/30/16 0411  WBC 9.2 7.1 5.8  HGB 11.6* 10.5* 9.9*  HCT 36.4* 32.5* 30.7*  PLT 238 270 274    Coag's  Recent Labs Lab 10/28/16 1733 10/29/16 0529 10/30/16 0411  INR 2.49 2.46 2.34    Sepsis Markers No results for input(s): LATICACIDVEN, PROCALCITON, O2SATVEN in the last 168 hours.  ABG  Recent Labs Lab 10/27/16 1441 10/27/16 1641 10/28/16 1000  PHART 7.288* 7.314* 7.398  PCO2ART 59.8* 56.4* 45.6  PO2ART 104.0 81.0* 66.5*    Liver Enzymes  Recent Labs Lab 10/27/16 1335  AST 17  ALT 14*  ALKPHOS 122  BILITOT 0.5  ALBUMIN 2.2*    Cardiac Enzymes  Recent Labs Lab 10/28/16 1733 10/28/16 2311 10/29/16 0529  TROPONINI 0.14* 0.15* 0.17*    Glucose  Recent Labs Lab 10/29/16 1201 10/29/16 1615 10/29/16 1956 10/29/16 2322 10/30/16 0413 10/30/16 0811  GLUCAP 127* 114* 111* 119* 100* 104*    Imaging Dg Chest Port 1 View  Result Date: 10/30/2016 CLINICAL DATA:  Acute respiratory failure. Shortness breath and congestive heart failure. EXAM:  PORTABLE CHEST 1 VIEW COMPARISON:  One-view chest x-ray 10/27/2016 FINDINGS: Heart size normal. Right-sided PICC line terminates in the distal SVC. Asymmetric right pleural effusion and airspace disease has increased slightly. Moderate pulmonary vascular congestion is present. Minimal left basilar airspace disease likely reflects atelectasis. IMPRESSION: 1. Increasing right pleural effusion and asymmetric lower lobe airspace disease is concerning for pneumonia. 2. Moderate pulmonary vascular congestion. 3. Mild left basilar airspace disease is stable, likely reflecting atelectasis. Electronically Signed   By: Marin Roberts M.D.   On: 10/30/2016 07:49   Dg Abd Portable 1v  Result Date: 10/29/2016 CLINICAL DATA:  Feeding tube placement EXAM: PORTABLE ABDOMEN - 1 VIEW COMPARISON:  04/20/2016 CT FINDINGS: The tip of a feeding tube is seen along the distal aspect of the third portion of the duodenum, possibly at the junction of the fourth portion. Lung volumes are low with patchy appearance of the visualized right lung. Surgical clips are seen in the right upper quadrant. The bowel gas pattern is unremarkable. Chronic superior endplate compression of L2. Lower lumbar facet arthropathy at L4-5 and L5-S1. No acute appearing osseous abnormality. IMPRESSION: The tip of a feeding tube is in the distal third portion of the duodenum possibly at the juncture with the fourth portion. Electronically Signed   By: Tollie Eth M.D.   On: 10/29/2016 18:04     STUDIES:  ECHO 11/27 >> consider re-eval when rhythm more stable, LV mildly dilated, LVEF 40-45%, diffuse hypokinesis, mild MR  CULTURES: Blood (11/25) x2 >>    ANTIBIOTICS: Vanc started at OSH 11/17 >> Levofloxacin 11/17 >> ?? (~11/20?) Cefepime 11/25 >>  SIGNIFICANT EVENTS: 11/25  started on BiPAP, tx to [facility]  11/27  Remains on bipap   LINES/TUBES: R PICC placed at OSH 11/24  DISCUSSION: 65 y/o man with MMP who presents to the ED with  respiratory failure the day after receiving sedation for PICC placement.  Recent concerns for staph epi bacteremia at OSH.    ASSESSMENT / PLAN:  PULMONARY A: COPD Acute on chronic hypercarbic / hypoxic respiratory failure Pneumonia Pulmonary edema P:   Continue BiPAP > cycle on / off  Q4 duo-nebs (STOP Albuterol if RVR reoccurs).  Continue Pulmicort neb Treat underlying PNA and volume overload Intermittent CXR  Hopeful to avoid intubation  CARDIOVASCULAR A:  CHF with acute decompensation  AF Troponin Leak - likely demand ischemia Digoxin toxicity P:  Hold Digoxin for now Continue amiodarone  Heparin per pharmacy  Tele monitoring    RENAL A:   Acute kidney injury - likely played a role in digoxin toxicity, admit sr cr 3.0, concern for cardiorenal syndrome  P:   Continue foley, monitor UOP Replace electrolytes as indicated  Discontinue foley   GASTROINTESTINAL A:   Hypoalbuminemia Likely malnutrition P:   NPO for now  SLP eval  for swallowing given overall weakness / risk for aspiration  Begin TF per nutrition in the interim  HEMATOLOGIC A:   Mild anemia Therapeutic AC with coumadin P:  Heparin per pharmacy Hold coumadin for now Monitor CBC   INFECTIOUS A:   HCAP Positive culture (Staph Epi) in blood - 1/2 cultures, likely contaminant  P:   Monitor cultures as above ABX as above, narrow as above  D4/x abx Monitor fever curve / WBC  ENDOCRINE A:   DM2 P:   SSI  NEUROLOGIC A:   Metabolic encephalopathy - resolving, in setting of hypercarbia / AKI P:   Tx underlying cause (hypercarbia) Supportive care PT consult for generalized deconditioning  FAMILY  - Updates: Discussion with wife on 11/25 > She was concerned that he be able to have a chance at a "decent" quality of life. I explained that in the context of reversible conditions (like sedation administration), measures such as ACLS mediations, electrotherapy, and mechanical ventilation  would provide a chance of reasonable quality of life, but that CPR for a true cardiac arrest would not likely provide him with a reasonable quality of life. Recommended he be a partial code reflecting these wishes, and she agreed.  No family available am 11/28 am.   - Inter-disciplinary family meet or Palliative Care meeting due by:  12/2  CC Time:  30 minutes.   Canary BrimBrandi Ollis, NP-C [facility] Pgr: [phone] or if no answer [phone] 10/30/2016, 8:53 AM   Attending Note:  I have examined patient, reviewed labs, studies and notes. I have discussed the case with B Ollis, and I agree with the data and plans as amended above. 65 yo man with COPD, dCHF, CKD, [facility] with resp failure, volume overload, A Fib + RVR. Developed RLL HCAP. Was discharged 11/24 but was quickly readmitted with multisystem failure - encephalopathy, combined hypoxic and hypercapneic resp failure, acute on chronic renal failure. He was treated w BiPAP with some stabilization. On my eval he is off  BiPAP, is weak but has a stronger voice today. He has coarse breath sounds. Will continue his abx, heparin, amiodarone. Will try to stay off BiPAP if he can tolerate. Hopefully he will continue to improve as renal failure resolves. Will need to further address goals of care.  Independent critical care time is 33 minutes.    Levy Pupaobert Ladesha Pacini, MD, PhD 10/30/2016, 10:53 AM [facility] [phone] or if no answer [phone]

## 2016-10-30 NOTE — Progress Notes (Signed)
*  PRELIMINARY RESULTS* Vascular Ultrasound Left upper extremity venous duplex has been completed.  Preliminary findings: No evidence of DVT. Superficial thrombosis noted in the left cephalic vein, forearm level.   Farrel DemarkJill Eunice, RDMS, RVT  10/30/2016, 12:52 PM

## 2016-10-30 NOTE — Care Management Note (Addendum)
Case Management Note  Patient Details  Name: Dylan Hubbard MRN: [id number] Date of Birth: 1951/05/13  Subjective/Objective:     Pt admitted with Altered mental status               Action/Plan:  PTA from home with wife - pt is on [place] but really confused and looks really deconditioned.  PT will be consulted when medically stable.  CM will continue to follow for discharge needs.  CM requested PT/OT eval via physician sticky notes   Expected Discharge Date:                  Expected Discharge Plan:     In-House Referral:     Discharge planning Services  CM Consult  Post Acute Care Choice:    Choice offered to:     DME Arranged:    DME Agency:     HH Arranged:    HH Agency:     Status of Service:  In process, will continue to follow  If discussed at Long Length of Stay Meetings, dates discussed:    Additional Comments: Pt taken off BIPAP early this am.  CM left voicemail for wife Dylan Hubbard, Dylan Trolinger S, RN 10/30/2016, 11:35 AM

## 2016-10-30 NOTE — Progress Notes (Signed)
ANTICOAGULATION CONSULT NOTE  Pharmacy Consult for Heparin Indication: atrial fibrillation  Allergies  Allergen Reactions  . Penicillins Rash    No other information available at this time    Patient Measurements: Weight: 178 lb 12.7 oz (81.1 kg) Heparin Dosing Weight:   Vital Signs: Temp: 99.5 F (37.5 C) (11/28 0739) Temp Source: Oral (11/28 0739) BP: 94/65 (11/28 0739) Pulse Rate: 96 (11/28 0739)  Labs:  Recent Labs  10/27/16 2102 10/28/16 1000 10/28/16 1733 10/28/16 2311 10/29/16 0529 10/29/16 1819 10/30/16 0411  HGB 11.6* 10.5*  --   --   --   --  9.9*  HCT 36.4* 32.5*  --   --   --   --  30.7*  PLT 238 270  --   --   --   --  274  LABPROT  --   --  27.4*  --  27.1*  --  26.1*  INR  --   --  2.49  --  2.46  --  2.34  CREATININE 3.34* 3.81*  --   --   --  4.38* 4.19*  TROPONINI  --   --  0.14* 0.15* 0.17*  --   --     CrCl cannot be calculated (Unknown ideal weight.).   Medical History: Past Medical History:  Diagnosis Date  . Anxiety   . Atrial fibrillation (HCC)   . CHF (congestive heart failure) (HCC)   . Coronary artery disease   . Diabetes mellitus without complication (HCC)   . GERD (gastroesophageal reflux disease)   . High cholesterol   . Hypertension   . Pneumonia   . Sepsis (HCC)   . Sleep apnea     Medications:  Prescriptions Prior to Admission  Medication Sig Dispense Refill Last Dose  . ALPRAZolam (XANAX) 0.5 MG tablet Take 0.5 mg by mouth every 8 (eight) hours as needed for anxiety.   unknown  . amiodarone (PACERONE) 200 MG tablet Take 200 mg by mouth daily.   unknown  . aspirin EC 81 MG tablet Take 81 mg by mouth every evening. 4pm   unknown  . atorvastatin (LIPITOR) 80 MG tablet Take 80 mg by mouth at bedtime.   unknown  . benzonatate (TESSALON) 100 MG capsule Take 100 mg by mouth every 8 (eight) hours as needed for cough.   unknown  . citalopram (CELEXA) 20 MG tablet Take 20 mg by mouth at bedtime.   unknown  . digoxin  (LANOXIN) 0.125 MG tablet Take 0.125 mg by mouth every evening. 5pm   unknown  . furosemide (LASIX) 20 MG tablet Take 20 mg by mouth.   unknown  . insulin regular (NOVOLIN R,HUMULIN R) 250 units/2.515mL (100 units/mL) injection Inject 0-8 Units into the skin 4 (four) times daily -  before meals and at bedtime. Per sliding scale: CBG 0-200 0 units, 201-250 2 units, 251-300 4 units, 301-350 6 units, 351-400 8 units, >400 contact MD   unknown  . ipratropium-albuterol (DUONEB) 0.5-2.5 (3) MG/3ML SOLN Take 3 mLs by nebulization every hour as needed (COPD).   unknown  . metoprolol tartrate (LOPRESSOR) 25 MG tablet Take 12.5 mg by mouth 2 (two) times daily.   unknown  . OXYGEN Inhale 2 L into the lungs continuous.   10/26/2016 at 11p-7a shift  . vancomycin IVPB Inject into the vein See admin instructions. Use 1.5 gram intravenously every 48 hours for Staph Epi Bactermia - start date 10/28/16 @@1000       Scheduled:  . amiodarone  200 mg Oral Daily  . aspirin EC  81 mg Oral QPM  . atorvastatin  80 mg Oral QHS  . budesonide (PULMICORT) nebulizer solution  0.5 mg Nebulization BID  . ceFEPime (MAXIPIME) IV  1 g Intravenous Q24H  . chlorhexidine  15 mL Mouth Rinse BID  . ipratropium-albuterol  3 mL Nebulization Q4H  . mouth rinse  15 mL Mouth Rinse q12n4p  . sodium chloride flush  10-40 mL Intracatheter Q12H    Assessment: 65yo male with history of Afib on warfarin PTA presents with AMS. Pharmacy is consulted to dose heparin for atrial fibrillation to start when INR<2. INR down slightly to 2.34 this AM. Noted renal dysfunction. CBC stable, no bleed documented.  Goal of Therapy:  Heparin level 0.3-0.7 units/ml Monitor platelets by anticoagulation protocol: Yes   Plan:  Start heparin when INR<2 Daily INR Monitor s/sx of bleeding   Babs BertinHaley Marti Mclane, PharmD, BCPS Clinical Pharmacist 10/30/2016 9:04 AM

## 2016-10-30 NOTE — Progress Notes (Signed)
Initial Nutrition Assessment  DOCUMENTATION CODES:   Not applicable  INTERVENTION:    D/C Vital High Protein formula   Initiate Vital AF 1.2 formula at goal rate of 70 ml/h (1680 ml per day) to provide 2016 kcals, 126 gm protein, 1362 ml free water daily  NUTRITION DIAGNOSIS:   Inadequate oral intake related to inability to eat as evidenced by NPO status  GOAL:   Patient will meet greater than or equal to 90% of their needs  MONITOR:   TF tolerance, Labs, Weight trends, Skin, I & O's  REASON FOR ASSESSMENT:   Consult Enteral/tube feeding initiation and management  ASSESSMENT:   65 y/o man with a history of CHF (EF 25-30%), AF on AC, as well as chronic respiratory disease (chart carries dx of COPD) with chronic hypoxic and hypercapnic respiratory failure  Pt presented with respiratory failure the day after receiving sedation for PICC line. He was started on BiPAP, and after a few hours, had some improvement in mental status and blood gas. CORTRAK small bore feeding tube placed 11/27 (tip at duodenum). Vital High Protein formula ordered via Adult Tube Feeding Protocol. Labs and medications reviewed.  RD unable to complete Nutrition Focused Physical Exam at this time.  Diet Order:  Diet NPO time specified  Skin:  Wound (see comment) (Stage II to sacrum)  Last BM:  N/A  Height:   Ht Readings from Last 1 Encounters:  10/30/16 5\' 7"  (1.702 m)    Weight:   Wt Readings from Last 1 Encounters:  10/30/16 178 lb 12.7 oz (81.1 kg)    Ideal Body Weight:  67.2 kg  BMI:  Body mass index is 28 kg/m.  Estimated Nutritional Needs:   Kcal:  1900-2100  Protein:  120-130 gm  Fluid:  per MD  EDUCATION NEEDS:   No education needs identified at this time  Maureen ChattersKatie Nyheim Seufert, RD, LDN Pager #: [phone] After-Hours Pager #: [phone]

## 2016-10-30 NOTE — Progress Notes (Signed)
Pharmacy Antibiotic Note  Dylan Hubbard is a 65 y.o. male admitted on 10/27/2016 with pneumonia.  Pharmacy has been consulted for Vancomycin dosing. Vancomycin random tonight has remained supratherapeutic but trending down.  Tmax 100.5 over the past 24 hours and WBC within normal limits for more than 72 hours. Patient has received 12 days of therapy with vancomycin.  Plan: Hold vancomycin tonight Determine length of therapy  Redose as appropriate   Temp (24hrs), Avg:99.3 F (37.4 C), Min:98.7 F (37.1 C), Max:100.1 F (37.8 C)   Recent Labs Lab 10/27/16 1335 10/27/16 2102  10/28/16 1000  10/29/16 1819 10/30/16 0411 10/30/16 1800  WBC 10.3 9.2  --  7.1  --   --  5.8  --   CREATININE 3.09* 3.34*  --  3.81*  --  4.38* 4.19*  --   VANCORANDOM  --   --   < >  --   < > 28  --  24  < > = values in this interval not displayed.  Estimated Creatinine Clearance: 18.2 mL/min (by C-G formula based on SCr of 4.19 mg/dL (H)).    Allergies  Allergen Reactions  . Penicillins Rash    No other information available at this time   Ruben Imony Keera Altidor, PharmD Clinical Pharmacist Pager: [phone] 10/30/2016 7:46 PM

## 2016-10-30 NOTE — Progress Notes (Signed)
MD notified about pt's inadequate UOP - foley was removed at 1100 and pt hasn't spontaneously voided since. In & out cath will be done and will continue to monitor pt closely.

## 2016-10-30 NOTE — Progress Notes (Signed)
eLink Physician-Brief Progress Note Patient Name: Dylan Hubbard DOB: 09-13-1951 MRN: [id number]   Date of Service  10/30/2016  HPI/Events of Note  Been neg last 2 days derop in BP Small bolus  eICU Interventions       Intervention Category Intermediate Interventions: Hypotension - evaluation and management  Nelda BucksFEINSTEIN,DANIEL J. 10/30/2016, 3:01 AM

## 2016-10-31 ENCOUNTER — Inpatient Hospital Stay (HOSPITAL_COMMUNITY): Payer: Medicare Other

## 2016-10-31 DIAGNOSIS — M7989 Other specified soft tissue disorders: Secondary | ICD-10-CM

## 2016-10-31 DIAGNOSIS — R0603 Acute respiratory distress: Secondary | ICD-10-CM

## 2016-10-31 DIAGNOSIS — R0600 Dyspnea, unspecified: Secondary | ICD-10-CM

## 2016-10-31 LAB — PROTIME-INR
INR: 2.11
Prothrombin Time: 24 seconds — ABNORMAL HIGH (ref 11.4–15.2)

## 2016-10-31 LAB — GLUCOSE, CAPILLARY
GLUCOSE-CAPILLARY: 107 mg/dL — AB (ref 65–99)
GLUCOSE-CAPILLARY: 119 mg/dL — AB (ref 65–99)
GLUCOSE-CAPILLARY: 143 mg/dL — AB (ref 65–99)
GLUCOSE-CAPILLARY: 178 mg/dL — AB (ref 65–99)
Glucose-Capillary: 132 mg/dL — ABNORMAL HIGH (ref 65–99)
Glucose-Capillary: 166 mg/dL — ABNORMAL HIGH (ref 65–99)

## 2016-10-31 LAB — CBC
HEMATOCRIT: 32 % — AB (ref 39.0–52.0)
HEMOGLOBIN: 10.2 g/dL — AB (ref 13.0–17.0)
MCH: 30.1 pg (ref 26.0–34.0)
MCHC: 31.9 g/dL (ref 30.0–36.0)
MCV: 94.4 fL (ref 78.0–100.0)
Platelets: 277 10*3/uL (ref 150–400)
RBC: 3.39 MIL/uL — AB (ref 4.22–5.81)
RDW: 14.9 % (ref 11.5–15.5)
WBC: 7 10*3/uL (ref 4.0–10.5)

## 2016-10-31 LAB — BASIC METABOLIC PANEL
ANION GAP: 7 (ref 5–15)
BUN: 39 mg/dL — ABNORMAL HIGH (ref 6–20)
CALCIUM: 8.1 mg/dL — AB (ref 8.9–10.3)
CO2: 28 mmol/L (ref 22–32)
Chloride: 107 mmol/L (ref 101–111)
Creatinine, Ser: 3.72 mg/dL — ABNORMAL HIGH (ref 0.61–1.24)
GFR, EST AFRICAN AMERICAN: 18 mL/min — AB (ref >60)
GFR, EST NON AFRICAN AMERICAN: 16 mL/min — AB (ref >60)
GLUCOSE: 194 mg/dL — AB (ref 65–99)
POTASSIUM: 3.6 mmol/L (ref 3.5–5.1)
SODIUM: 142 mmol/L (ref 135–145)

## 2016-10-31 LAB — PHOSPHORUS
PHOSPHORUS: 2.8 mg/dL (ref 2.5–4.6)
PHOSPHORUS: 2.4 mg/dL — AB (ref 2.5–4.6)

## 2016-10-31 LAB — MAGNESIUM
MAGNESIUM: 1.8 mg/dL (ref 1.7–2.4)
MAGNESIUM: 1.7 mg/dL (ref 1.7–2.4)

## 2016-10-31 MED ORDER — GERHARDT'S BUTT CREAM
TOPICAL_CREAM | Freq: Three times a day (TID) | CUTANEOUS | Status: DC
  Administered 2016-10-31 – 2016-11-02 (×5): via TOPICAL
  Administered 2016-11-02: 1 via TOPICAL
  Administered 2016-11-02 – 2016-11-03 (×4): via TOPICAL
  Administered 2016-11-04: 1 via TOPICAL
  Administered 2016-11-04 – 2016-11-05 (×2): via TOPICAL
  Administered 2016-11-05: 1 via TOPICAL
  Administered 2016-11-05: 17:00:00 via TOPICAL
  Administered 2016-11-05: 1 via TOPICAL
  Administered 2016-11-06: 18:00:00 via TOPICAL
  Administered 2016-11-06 – 2016-11-07 (×2): 1 via TOPICAL
  Administered 2016-11-07 – 2016-11-08 (×2): via TOPICAL
  Administered 2016-11-08 (×2): 1 via TOPICAL
  Administered 2016-11-08 – 2016-11-13 (×10): via TOPICAL
  Filled 2016-10-31 (×2): qty 1

## 2016-10-31 MED ORDER — LEVALBUTEROL HCL 0.63 MG/3ML IN NEBU
0.6300 mg | INHALATION_SOLUTION | RESPIRATORY_TRACT | Status: DC
  Administered 2016-10-31 – 2016-11-01 (×7): 0.63 mg via RESPIRATORY_TRACT
  Filled 2016-10-31 (×7): qty 3

## 2016-10-31 MED ORDER — FUROSEMIDE 10 MG/ML IJ SOLN
80.0000 mg | Freq: Two times a day (BID) | INTRAMUSCULAR | Status: AC
  Administered 2016-10-31 – 2016-11-01 (×4): 80 mg via INTRAVENOUS
  Filled 2016-10-31 (×4): qty 8

## 2016-10-31 MED ORDER — INSULIN ASPART 100 UNIT/ML ~~LOC~~ SOLN
0.0000 [IU] | SUBCUTANEOUS | Status: DC
  Administered 2016-10-31: 1 [IU] via SUBCUTANEOUS
  Administered 2016-10-31 – 2016-11-01 (×3): 2 [IU] via SUBCUTANEOUS
  Administered 2016-11-01: 1 [IU] via SUBCUTANEOUS
  Administered 2016-11-01: 2 [IU] via SUBCUTANEOUS
  Administered 2016-11-01: 1 [IU] via SUBCUTANEOUS
  Administered 2016-11-01: 2 [IU] via SUBCUTANEOUS
  Administered 2016-11-02: 9 [IU] via SUBCUTANEOUS
  Administered 2016-11-02: 3 [IU] via SUBCUTANEOUS

## 2016-10-31 MED ORDER — IPRATROPIUM BROMIDE 0.02 % IN SOLN
0.5000 mg | RESPIRATORY_TRACT | Status: DC
  Administered 2016-10-31 – 2016-11-01 (×8): 0.5 mg via RESPIRATORY_TRACT
  Filled 2016-10-31 (×7): qty 2.5

## 2016-10-31 NOTE — Significant Event (Addendum)
Went into room to get patient back into bed. Noted feeding tube was on patient's lap, patient scratching on his nose. Patient stated "I pulled it out, I don't want it".   Patient placed back to bed. E-link RN Sarah camera in and made aware to let MD in E-Link know if staff need to replace feeding tube.

## 2016-10-31 NOTE — Progress Notes (Signed)
PULMONARY / CRITICAL CARE MEDICINE   Name: Dylan ClinesDouglas E Rys MRN: [id number] DOB: March 02, 1951    ADMISSION DATE:  10/27/2016 CONSULTATION DATE:  October 31, 2016  CHIEF COMPLAINT:  Altered mental status  SUMMARY:   65 y/o man with a history of CHF (EF 25-30%), AF on AC, as well as chronic respiratory disease (chart carries dx of COPD) with chronic hypoxic and hypercapnic respiratory failure. He presented to [facility] on 11/6 with progressively worsening dyspnea. He apparently had stopped his medications (which include lasix). He also had AF with RVR, with an attempt at cardioversion on 11/10 which was unsuccessful. He was rate (but not rhythm) controlled with amio and digoxin, both of which appeared to be new medications for him. He developed fever on 11/17, and CXR demonstrated RLL consolidation. He was initially treated with levofloxacin and vanc, but when his blood culture grew staph epi, it appears that he was narrowed to vanc only. He also had a steadily increasing creatinine from baseline <1 to ~3.0 on presentation to [facility].  He was discharged from [facility] on 11/24, and a PICC line was placed on the day of discharge. His wife spoke with him after the procedure, and said he was "out of head" yesterday afternoon because of the sedation he received for the procedure. On the morning of 11/25, he was found to be poorly responsive and EMS was called. Rather than re[facility], [facility]. He found to be in acute on chronic hypercarbic respiratory failure. He was started on BiPAP, and after a few hours, he did have some improvement in his mental status and blood gas.  INTERVAL HISTORY:   RT reports pt on BiPAP overnight.  No acute events.     VITAL SIGNS: BP (!) 126/49 (BP Location: Left Arm)   Pulse (!) 48   Temp 98.4 F (36.9 C) (Axillary)   Resp (!) 21   Ht 5\' 7"  (1.702 m)   Wt 174 lb 2.6 oz (79 kg)   SpO2 98%   BMI 27.28 kg/m   HEMODYNAMICS:     VENTILATOR SETTINGS: BiPAP: 18/7 FiO2 (%):  [30 %] 30 %  INTAKE / OUTPUT: I/O last 3 completed shifts: In: 1722.5 [I.V.:320; NG/GT:1302.5; IV Piggyback:100] Out: 1261 [Urine:1260; Stool:1]  PHYSICAL EXAMINATION: General:  Chronically ill-appearing man, in NAD Neuro: awake, alert, slow to respond, oriented to self, follows commands  HEENT:  MM pink/dry  Cardiovascular:  Distant heart sounds, elevated JVD, 2+ LE edema. Lungs:  Even/non-labored, lungs bilaterally CTA Abdomen:  Obese Musculoskeletal:  No deformities Skin:  No rashes on visible skin  LABS:  BMET  Recent Labs Lab 10/29/16 1819 10/30/16 0411 10/31/16 0310  NA 138 140 142  K 3.9 3.6 3.6  CL 103 104 107  CO2 25 28 28   BUN 34* 33* 39*  CREATININE 4.38* 4.19* 3.72*  GLUCOSE 114* 102* 194*    Electrolytes  Recent Labs Lab 10/29/16 1819 10/30/16 0411 10/30/16 0920 10/30/16 1648 10/31/16 0310  CALCIUM 8.3* 8.3*  --   --  8.1*  MG  --   --  1.9 1.9 1.8  PHOS  --   --  4.0 3.6 2.8    CBC  Recent Labs Lab 10/28/16 1000 10/30/16 0411 10/31/16 0310  WBC 7.1 5.8 7.0  HGB 10.5* 9.9* 10.2*  HCT 32.5* 30.7* 32.0*  PLT 270 274 277    Coag's  Recent Labs Lab 10/29/16 0529 10/30/16 0411 10/31/16 0310  INR 2.46 2.34 2.11  Sepsis Markers No results for input(s): LATICACIDVEN, PROCALCITON, O2SATVEN in the last 168 hours.  ABG  Recent Labs Lab 10/27/16 1441 10/27/16 1641 10/28/16 1000  PHART 7.288* 7.314* 7.398  PCO2ART 59.8* 56.4* 45.6  PO2ART 104.0 81.0* 66.5*    Liver Enzymes  Recent Labs Lab 10/27/16 1335  AST 17  ALT 14*  ALKPHOS 122  BILITOT 0.5  ALBUMIN 2.2*    Cardiac Enzymes  Recent Labs Lab 10/28/16 1733 10/28/16 2311 10/29/16 0529  TROPONINI 0.14* 0.15* 0.17*    Glucose  Recent Labs Lab 10/30/16 1223 10/30/16 1612 10/30/16 1929 10/30/16 2340 10/31/16 0350 10/31/16 0842  GLUCAP 116* 107* 131* 158* 178* 132*    Imaging Dg Chest Port 1  View  Result Date: 10/31/2016 CLINICAL DATA:  Acute respiratory failure with hypoxia. EXAM: PORTABLE CHEST 1 VIEW COMPARISON:  10/30/2016 FINDINGS: PICC line tip near the superior cavoatrial junction. Again noted are densities in the mid and lower right chest with evidence for a right pleural effusion. Left lung is clear. Heart size is normal. Feeding tube extends into the abdomen. The trachea is midline. Negative for a pneumothorax. IMPRESSION: Persistent densities in the right lower chest are suggestive for a combination airspace disease and pleural fluid. Minimal change from the prior examination. Electronically Signed   By: Richarda OverlieAdam  Henn M.D.   On: 10/31/2016 07:57     STUDIES:  ECHO 11/27 >> consider re-eval when rhythm more stable, LV mildly dilated, LVEF 40-45%, diffuse hypokinesis, mild MR  CULTURES: Blood (11/25) x2 >>    ANTIBIOTICS: Vanc started at OSH 11/17 >> Levofloxacin 11/17 >> ?? (~11/20?) Cefepime 11/25 >>  SIGNIFICANT EVENTS: 11/25  started on BiPAP, 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  Recent concerns for staph epi bacteremia at OSH.    ASSESSMENT / PLAN:  PULMONARY A: COPD Acute on chronic hypercarbic / hypoxic respiratory failure Pneumonia Pulmonary edema P:   Continue BiPAP > cycle on / off  Q4 duo-nebs (STOP Albuterol if RVR reoccurs).  Continue Pulmicort neb Treat underlying PNA and volume overload Intermittent CXR   CARDIOVASCULAR A:  CHF with acute decompensation  AF Troponin Leak - likely demand ischemia Digoxin toxicity P:  Hold Digoxin for now Continue amiodarone  Heparin per pharmacy  Tele monitoring    RENAL A:   Acute kidney injury - likely played a role in digoxin toxicity, admit sr cr 3.0, concern for cardiorenal syndrome.   Slowly improving.  P:   Monitor UOP Replace electrolytes as indicated   GASTROINTESTINAL A:   Hypoalbuminemia Likely malnutrition P:   NPO for now  SLP eval for swallowing given overall weakness / risk for aspiration  TF per nutrition in the interim  HEMATOLOGIC A:   Mild anemia Therapeutic AC with coumadin P:  Heparin per pharmacy Hold coumadin for now Monitor CBC   INFECTIOUS A:   HCAP Positive culture (Staph Epi) in blood - 1/2 cultures, likely contaminant  P:   Monitor cultures as above ABX as above, narrow as above  D5/7 abx Monitor fever curve / WBC  ENDOCRINE A:   DM2 P:   SSI  NEUROLOGIC A:   Metabolic encephalopathy - resolving, in setting of hypercarbia / AKI P:   Tx underlying cause (hypercarbia) Supportive care  PT consult for generalized deconditioning  FAMILY  - Updates: Discussion with wife on 11/25 > She was concerned that he be able to have a chance at a "decent" quality of life. I explained that in the context of reversible conditions (like sedation administration), measures such as ACLS mediations, electrotherapy, and mechanical ventilation would provide a chance of reasonable quality of life, but that CPR for a true cardiac arrest would not likely provide him with a reasonable quality of life. Recommended he be a partial code reflecting these wishes, and she agreed.  No family available am 11/29 am.   - Inter-disciplinary family meet or Palliative Care meeting due by:  12/2  CC Time:  30 minutes.   Canary Brim, NP-C [facility] Pgr: [phone] or if no answer [phone] 10/31/2016, 9:07 AM   STAFF NOTE: I, Rory Percy, MD FACP have personally reviewed patient's available data, including medical history, events of note, physical examination and test results as part of my evaluation. I have discussed with resident/NP and other care providers such as pharmacist, RN and RRT. In addition, I personally evaluated  patient and elicited key findings of: in chair, no distress, ronchi, pos baalnce again, pcxr slight improved aeration rt base, abx to stop date, bipap is noctunral, can move to sdu, lasix to neg baalnce  Mcarthur Rossetti. Tyson Alias, MD, FACP Pgr: [phone] [facility] 10/31/2016 11:08 AM

## 2016-10-31 NOTE — Significant Event (Signed)
RN removed allevyn foams on bridge of nose, on forehead, left temple, and around mouth to assess skin after the bipap mask was removed. Noted there are skin breakdown on forehead, left temple side, and bridge of nose. Some with scant skin tears, pink and purple. Documented these as unstagable ulcers from device-related (bipap) on LDA.   RN retape/resecure feeding tube to right cheek with DuoDerm instead of securing with tapes to nose. Left these areas open to air to dry at this time. Will place foam later on for when bipap is place on patient.

## 2016-10-31 NOTE — Significant Event (Signed)
RN attempted to call report twice to receiving RN in 3S. Receiving dayshift RN unable to take report. Gave number for call back. RN called again, received message from receiving RN in 3S to call incoming nightshift RN for report-unable to take report at this time.

## 2016-10-31 NOTE — Progress Notes (Signed)
PT Cancellation Note  Patient Details Name: Dylan Hubbard MRN: [id number] DOB: 1951/05/25   Cancelled Treatment:    Reason Eval/Treat Not Completed: Patient not medically ready. RN deferred as pt still on BiPAP. PT to return as able.   Dylan Hubbard 10/31/2016, 7:31 AM   Dylan Hubbard, PT, DPT Pager #: [phone] Office #: [phone]

## 2016-10-31 NOTE — Significant Event (Signed)
Air mattress overly ordered for patient to help protect patient's skin from further breakdown. Will put patient in this bed prior to transfer to 3S.

## 2016-10-31 NOTE — Care Management Note (Signed)
Case Management Note  Patient Details  Name: Dylan Hubbard MRN: [id number] Date of Birth: Jan 21, 1951  Subjective/Objective:     Pt admitted with Altered mental status               Action/Plan:  PTA from home with wife - pt is on [place] but really confused and looks really deconditioned.  PT will be consulted when medically stable.  CM will continue to follow for discharge needs.  CM requested PT/OT eval via physician sticky notes   Expected Discharge Date:                  Expected Discharge Plan:     In-House Referral:  Clinical Social Work  Discharge planning Services  CM Consult  Post Acute Care Choice:    Choice offered to:     DME Arranged:    DME Agency:     HH Arranged:    HH Agency:     Status of Service:  In process, will continue to follow  If discussed at Long Length of Stay Meetings, dates discussed:    Additional Comments: 10/31/2016 CM spoke in depth with pts wife - [place] [facility] stay in [facility] -  Therefore pt did not come from home as previously communicated.  Wife voiced concerns regarding current confusion and deconditioning - she expects pt to discharge back to SNF.  CSW consulted for placement.  PT eval ordered  10/30/16 Pt taken off BIPAP early this am.  CM left voicemail for wife Dylan Hubbard, Dylan Wrigley S, RN 10/31/2016, 10:33 AM

## 2016-10-31 NOTE — Significant Event (Signed)
Cortrak feeding tube replaced via right nare-using existing tube that patient removed this evening. Patient tolerated procedure. Marked at 109cm at the nare. RN secured tube with duoderm to face (unable to secure to nose r/t ulcers). KUB ordered. Will await on confirmation prior to start of tube feeds and medication administration.

## 2016-10-31 NOTE — Evaluation (Signed)
Clinical/Bedside Swallow Evaluation Patient Details  Name: Dylan Hubbard MRN: [id number] Date of Birth: [birth year]  Today's Date: 10/31/2016 Time: SLP Start Time (ACUTE ONLY): 1006 SLP Stop Time (ACUTE ONLY): 1019 SLP Time Calculation (min) (ACUTE ONLY): 13 min  Past Medical History:  Past Medical History:  Diagnosis Date  . Anxiety   . Atrial fibrillation (HCC)   . CHF (congestive heart failure) (HCC)   . Coronary artery disease   . Diabetes mellitus without complication (HCC)   . GERD (gastroesophageal reflux disease)   . High cholesterol   . Hypertension   . Pneumonia   . Sepsis (HCC)   . Sleep apnea    Past Surgical History: History reviewed. No pertinent surgical history. HPI:  65 yo man with COPD, dCHF, CKD, fecent[facility] at [facility] with resp `failure, volume overload, A Fib + RVR. Developed RLL HCAP. Was discharged 11/24 but was quickly readmitted with multisystem failure - encephalopathy, combined hypoxic and hypercapneic resp failure, acute on chronic renal failure. He was treated w BiPAP with some stabilization.   Assessment / Plan / Recommendation Clinical Impression  Pt referred for clinical assessment of swallow function given high risk for aspiration due to debility and AMS. Pt required verbal cueing and encouragement to participate and in general did not appear to be interested in PO intake. Pt trialed with ice chips and thin water via teaspoon. Appearance of delay with pharyngeal initiation noted. No overt s/s aspiration, however limited trials as pt declined further participation with additional boluses and textures. Will follow and continue to assess. Pt may benefit from objective measure of swallow given recent medical events and respiratory compromise.     Aspiration Risk  Moderate aspiration risk    Diet Recommendation NPO;Alternative means - temporary   Medication Administration: Via alternative means    Other  Recommendations Oral Care Recommendations: Oral  care BID   Follow up Recommendations  (TBD)      Frequency and Duration min 2x/week  3 weeks       Prognosis Prognosis for Safe Diet Advancement: Good Barriers to Reach Goals: Motivation (AMS)      Swallow Study   General Date of Onset: 10/27/16 HPI: 65 yo man with COPD, dCHF, CKD, [facility] with resp `failure, volume overload, A Fib + RVR. Developed RLL HCAP. Was discharged 11/24 but was quickly readmitted with multisystem failure - encephalopathy, combined hypoxic and hypercapneic resp failure, acute on chronic renal failure. He was treated w BiPAP with some stabilization. Type of Study: Bedside Swallow Evaluation Previous Swallow Assessment: none known Diet Prior to this Study: NPO;NG Tube Temperature Spikes Noted: Yes Respiratory Status: Nasal cannula (alternating with Bipap- had been off for 30 min at eval) History of Recent Intubation: No Behavior/Cognition: Requires cueing Oral Cavity Assessment: Within Functional Limits Oral Care Completed by SLP: Recent completion by staff Oral Cavity - Dentition: Edentulous Self-Feeding Abilities: Needs assist Patient Positioning: Upright in chair Baseline Vocal Quality: Low vocal intensity Volitional Cough: Cognitively unable to elicit Volitional Swallow: Unable to elicit    Oral/Motor/Sensory Function Overall Oral Motor/Sensory Function: Within functional limits   Ice Chips Ice chips: Impaired Presentation: Spoon Pharyngeal Phase Impairments: Suspected delayed Swallow   Thin Liquid Thin Liquid: Impaired Presentation: Spoon Pharyngeal  Phase Impairments: Suspected delayed Swallow    Nectar Thick     Honey Thick     Puree     Solid   GO            Diannia RuderKara E  Bland Rudzinski MA, CCC-SLP  Pager [phone] 10/31/2016,10:30 AM

## 2016-10-31 NOTE — Progress Notes (Signed)
ANTICOAGULATION CONSULT NOTE  Pharmacy Consult for Heparin Indication: atrial fibrillation  Allergies  Allergen Reactions  . Penicillins Rash    No other information available at this time    Patient Measurements: Height: 5\' 7"  (170.2 cm) Weight: 174 lb 2.6 oz (79 kg) IBW/kg (Calculated) : 66.1 Heparin Dosing Weight:   Vital Signs: Temp: 98.4 F (36.9 C) (11/29 0842) Temp Source: Axillary (11/29 0842) BP: 126/49 (11/29 0842) Pulse Rate: 48 (11/29 0842)  Labs:  Recent Labs  10/28/16 1733 10/28/16 2311 10/29/16 0529 10/29/16 1819 10/30/16 0411 10/31/16 0310  HGB  --   --   --   --  9.9* 10.2*  HCT  --   --   --   --  30.7* 32.0*  PLT  --   --   --   --  274 277  LABPROT 27.4*  --  27.1*  --  26.1* 24.0*  INR 2.49  --  2.46  --  2.34 2.11  CREATININE  --   --   --  4.38* 4.19* 3.72*  TROPONINI 0.14* 0.15* 0.17*  --   --   --     Estimated Creatinine Clearance: 18.8 mL/min (by C-G formula based on SCr of 3.72 mg/dL (H)).   Medical History: Past Medical History:  Diagnosis Date  . Anxiety   . Atrial fibrillation (HCC)   . CHF (congestive heart failure) (HCC)   . Coronary artery disease   . Diabetes mellitus without complication (HCC)   . GERD (gastroesophageal reflux disease)   . High cholesterol   . Hypertension   . Pneumonia   . Sepsis (HCC)   . Sleep apnea     Medications:  Prescriptions Prior to Admission  Medication Sig Dispense Refill Last Dose  . ALPRAZolam (XANAX) 0.5 MG tablet Take 0.5 mg by mouth every 8 (eight) hours as needed for anxiety.   unknown  . amiodarone (PACERONE) 200 MG tablet Take 200 mg by mouth daily.   unknown  . aspirin EC 81 MG tablet Take 81 mg by mouth every evening. 4pm   unknown  . atorvastatin (LIPITOR) 80 MG tablet Take 80 mg by mouth at bedtime.   unknown  . benzonatate (TESSALON) 100 MG capsule Take 100 mg by mouth every 8 (eight) hours as needed for cough.   unknown  . citalopram (CELEXA) 20 MG tablet Take 20 mg by  mouth at bedtime.   unknown  . digoxin (LANOXIN) 0.125 MG tablet Take 0.125 mg by mouth every evening. 5pm   unknown  . furosemide (LASIX) 20 MG tablet Take 20 mg by mouth.   unknown  . insulin regular (NOVOLIN R,HUMULIN R) 250 units/2.65mL (100 units/mL) injection Inject 0-8 Units into the skin 4 (four) times daily -  before meals and at bedtime. Per sliding scale: CBG 0-200 0 units, 201-250 2 units, 251-300 4 units, 301-350 6 units, 351-400 8 units, >400 contact MD   unknown  . ipratropium-albuterol (DUONEB) 0.5-2.5 (3) MG/3ML SOLN Take 3 mLs by nebulization every hour as needed (COPD).   unknown  . metoprolol tartrate (LOPRESSOR) 25 MG tablet Take 12.5 mg by mouth 2 (two) times daily.   unknown  . OXYGEN Inhale 2 L into the lungs continuous.   10/26/2016 at 11p-7a shift  . vancomycin IVPB Inject into the vein See admin instructions. Use 1.5 gram intravenously every 48 hours for Staph Epi Bactermia - start date 10/28/16 @@1000       Scheduled:  . amiodarone  200  mg Oral Daily  . aspirin  81 mg Per Tube QPM  . atorvastatin  80 mg Oral QHS  . budesonide (PULMICORT) nebulizer solution  0.5 mg Nebulization BID  . ceFEPime (MAXIPIME) IV  1 g Intravenous Q24H  . chlorhexidine  15 mL Mouth Rinse BID  . Gerhardt's butt cream   Topical TID  . insulin aspart  0-9 Units Subcutaneous Q4H  . ipratropium-albuterol  3 mL Nebulization Q4H  . mouth rinse  15 mL Mouth Rinse q12n4p  . sodium chloride flush  10-40 mL Intracatheter Q12H    Assessment: 65yo male with history of Afib on warfarin PTA presents with AMS. Pharmacy is consulted to dose heparin for atrial fibrillation to start when INR<2. INR drifting down slowly to 2.11. Noted renal dysfunction, now improving. CBC stable, no bleed documented.  Goal of Therapy:  Heparin level 0.3-0.7 units/ml Monitor platelets by anticoagulation protocol: Yes   Plan:  Start heparin when INR<2 - likely 11/30 Daily INR Monitor s/sx of bleeding   Babs BertinHaley Einer Meals,  PharmD, BCPS Clinical Pharmacist 10/31/2016 10:22 AM

## 2016-10-31 NOTE — Progress Notes (Signed)
Pharmacy Antibiotic Note  Dylan Hubbard is a 65 y.o. male admitted on 10/27/2016 with pneumonia.  Pharmacy has been consulted for Vancomycin/Cefepime dosing. Vancomycin random has remained supratherapeutic but trending down slowly to 24. Tmax 100.5 over the past 24 hours and WBC within normal limits. Patient has received 12 days of therapy with vancomycin, including at OSH. Per CCM note, antibiotics D#5/7.   Plan: -Vancomycin on hold - d/c? Likely will be therapeutic through end of LOT -Cefepime 1g IV q24h  -Monitor clinical status, renal function, culture results -Vancomycin levels as indicated   Temp (24hrs), Avg:99.4 F (37.4 C), Min:98.4 F (36.9 C), Max:100.5 F (38.1 C)   Recent Labs Lab 10/27/16 1335 10/27/16 2102  10/28/16 1000  10/29/16 1819 10/30/16 0411 10/30/16 1800 10/31/16 0310  WBC 10.3 9.2  --  7.1  --   --  5.8  --  7.0  CREATININE 3.09* 3.34*  --  3.81*  --  4.38* 4.19*  --  3.72*  VANCORANDOM  --   --   < >  --   < > 28  --  24  --   < > = values in this interval not displayed.  Estimated Creatinine Clearance: 18.8 mL/min (by C-G formula based on SCr of 3.72 mg/dL (H)).    Allergies  Allergen Reactions  . Penicillins Rash    No other information available at this time   Babs BertinHaley Parvin Stetzer, PharmD, BCPS Clinical Pharmacist 10/31/2016 10:30 AM

## 2016-10-31 NOTE — Progress Notes (Signed)
Pt transferred to 3S09 by RN and NT. VSS, belongings and IV pole at bedside. Two RN's received pt at bedside. Pt's wife updated about transfer.

## 2016-10-31 NOTE — Care Management Important Message (Signed)
Important Message  Patient Details  Name: Van ClinesDouglas E Tlatelpa MRN: [id number] Date of Birth: Jan 18, 1951   Medicare Important Message Given:  Yes    Kyla BalzarineShealy, Zaryia Markel Abena 10/31/2016, 10:26 AM

## 2016-10-31 NOTE — Significant Event (Signed)
Patient with continue loose stools, has pressure ulcers on sacrum. RN placed flexiseal in as there was an order in EPIC since yesterday. RN also placed external condom catheter to protect further skin breakdown from incontinence. Will order cream for skin breakdown on sacrum.

## 2016-11-01 ENCOUNTER — Inpatient Hospital Stay (HOSPITAL_COMMUNITY): Payer: Medicare Other

## 2016-11-01 DIAGNOSIS — I5022 Chronic systolic (congestive) heart failure: Secondary | ICD-10-CM

## 2016-11-01 DIAGNOSIS — I132 Hypertensive heart and chronic kidney disease with heart failure and with stage 5 chronic kidney disease, or end stage renal disease: Secondary | ICD-10-CM

## 2016-11-01 DIAGNOSIS — J9601 Acute respiratory failure with hypoxia: Secondary | ICD-10-CM

## 2016-11-01 DIAGNOSIS — J810 Acute pulmonary edema: Secondary | ICD-10-CM

## 2016-11-01 DIAGNOSIS — I4891 Unspecified atrial fibrillation: Secondary | ICD-10-CM

## 2016-11-01 DIAGNOSIS — I1310 Hypertensive heart and chronic kidney disease without heart failure, with stage 1 through stage 4 chronic kidney disease, or unspecified chronic kidney disease: Secondary | ICD-10-CM

## 2016-11-01 LAB — GLUCOSE, CAPILLARY
GLUCOSE-CAPILLARY: 147 mg/dL — AB (ref 65–99)
GLUCOSE-CAPILLARY: 168 mg/dL — AB (ref 65–99)
Glucose-Capillary: 180 mg/dL — ABNORMAL HIGH (ref 65–99)
Glucose-Capillary: 181 mg/dL — ABNORMAL HIGH (ref 65–99)
Glucose-Capillary: 176 mg/dL — ABNORMAL HIGH (ref 65–99)

## 2016-11-01 LAB — BASIC METABOLIC PANEL
ANION GAP: 9 (ref 5–15)
BUN: 45 mg/dL — ABNORMAL HIGH (ref 6–20)
CO2: 31 mmol/L (ref 22–32)
Calcium: 8.1 mg/dL — ABNORMAL LOW (ref 8.9–10.3)
Chloride: 103 mmol/L (ref 101–111)
Creatinine, Ser: 2.94 mg/dL — ABNORMAL HIGH (ref 0.61–1.24)
GFR calc Af Amer: 24 mL/min — ABNORMAL LOW (ref >60)
GFR calc non Af Amer: 21 mL/min — ABNORMAL LOW (ref >60)
GLUCOSE: 138 mg/dL — AB (ref 65–99)
POTASSIUM: 3.4 mmol/L — AB (ref 3.5–5.1)
Sodium: 143 mmol/L (ref 135–145)

## 2016-11-01 LAB — CBC
HEMATOCRIT: 33.5 % — AB (ref 39.0–52.0)
Hemoglobin: 10.8 g/dL — ABNORMAL LOW (ref 13.0–17.0)
MCH: 30.3 pg (ref 26.0–34.0)
MCHC: 32.2 g/dL (ref 30.0–36.0)
MCV: 94.1 fL (ref 78.0–100.0)
Platelets: 296 10*3/uL (ref 150–400)
RBC: 3.56 MIL/uL — AB (ref 4.22–5.81)
RDW: 14.7 % (ref 11.5–15.5)
WBC: 8.9 10*3/uL (ref 4.0–10.5)

## 2016-11-01 LAB — PROTIME-INR
INR: 1.5
Prothrombin Time: 18.3 seconds — ABNORMAL HIGH (ref 11.4–15.2)

## 2016-11-01 LAB — HEPARIN LEVEL (UNFRACTIONATED): Heparin Unfractionated: 0.32 IU/mL (ref 0.30–0.70)

## 2016-11-01 MED ORDER — POTASSIUM CHLORIDE 20 MEQ/15ML (10%) PO SOLN
50.0000 meq | Freq: Once | ORAL | Status: AC
  Administered 2016-11-01: 50 meq
  Filled 2016-11-01: qty 45

## 2016-11-01 MED ORDER — SODIUM CHLORIDE 0.9% FLUSH
10.0000 mL | Freq: Two times a day (BID) | INTRAVENOUS | Status: DC
  Administered 2016-11-02 – 2016-11-03 (×4): 10 mL
  Administered 2016-11-04: 20 mL
  Administered 2016-11-04 – 2016-11-05 (×2): 10 mL

## 2016-11-01 MED ORDER — IPRATROPIUM BROMIDE 0.02 % IN SOLN
0.5000 mg | Freq: Four times a day (QID) | RESPIRATORY_TRACT | Status: DC
  Administered 2016-11-01 – 2016-11-04 (×9): 0.5 mg via RESPIRATORY_TRACT
  Filled 2016-11-01 (×10): qty 2.5

## 2016-11-01 MED ORDER — SODIUM CHLORIDE 0.9% FLUSH
10.0000 mL | INTRAVENOUS | Status: DC | PRN
  Administered 2016-11-10 – 2016-11-12 (×5): 10 mL
  Filled 2016-11-01 (×5): qty 40

## 2016-11-01 MED ORDER — METHYLPREDNISOLONE SODIUM SUCC 125 MG IJ SOLR
60.0000 mg | INTRAMUSCULAR | Status: DC
  Administered 2016-11-01 – 2016-11-02 (×2): 60 mg via INTRAVENOUS
  Filled 2016-11-01 (×2): qty 2

## 2016-11-01 MED ORDER — WHITE PETROLATUM GEL
Freq: Every day | Status: DC
  Administered 2016-11-01 – 2016-11-02 (×2): via TOPICAL
  Administered 2016-11-03: 1 via TOPICAL
  Administered 2016-11-04 – 2016-11-06 (×3): via TOPICAL
  Filled 2016-11-01: qty 1

## 2016-11-01 MED ORDER — LEVALBUTEROL HCL 0.63 MG/3ML IN NEBU
0.6300 mg | INHALATION_SOLUTION | Freq: Four times a day (QID) | RESPIRATORY_TRACT | Status: DC
  Administered 2016-11-02 – 2016-11-04 (×8): 0.63 mg via RESPIRATORY_TRACT
  Filled 2016-11-01 (×10): qty 3

## 2016-11-01 MED ORDER — HEPARIN (PORCINE) IN NACL 100-0.45 UNIT/ML-% IJ SOLN
1200.0000 [IU]/h | INTRAMUSCULAR | Status: DC
  Administered 2016-11-01: 1200 [IU]/h via INTRAVENOUS
  Administered 2016-11-02 – 2016-11-03 (×2): 1250 [IU]/h via INTRAVENOUS
  Administered 2016-11-04: 1200 [IU]/h via INTRAVENOUS
  Filled 2016-11-01 (×5): qty 250

## 2016-11-01 NOTE — Progress Notes (Signed)
Speech Language Pathology Treatment: Dysphagia  Patient Details Name: Dylan Hubbard MRN: [id number] DOB: March 30, 1951 Today's Date: 11/01/2016 Time: [id number] SLP Time Calculation (min) (ACUTE ONLY): 19 min  Assessment / Plan / Recommendation Clinical Impression  Pt consumed various consistencies with increased acceptance, although still without desire for POs. Today he has delayed coughing following intake, concerning for potentially impaired airway protection. Given the above and his mentation, recommend to continue NPO status with meds by alternative means. Of note, after SLP returned to the room pt appeared to have pulled his NGT slightly out from his nose, making continued attempts to pull it further. RN notified and TF held. RN readjusted NGT and mitts were placed on pt to increase safety. Will continue to follow for PO readiness.   HPI HPI: 65 yo man with COPD, dCHF, CKD, [facility] resp `failure, volume overload, A Fib + RVR. Developed RLL HCAP. Was discharged 11/24 but was quickly readmitted with multisystem failure - encephalopathy, combined hypoxic and hypercapneic resp failure, acute on chronic renal failure. He was treated w BiPAP with some stabilization.      SLP Plan  Continue with current plan of care     Recommendations  Diet recommendations: NPO Medication Administration: Via alternative means                Oral Care Recommendations: Oral care QID Follow up Recommendations:  (ttba) Plan: Continue with current plan of care       GO                Dylan Hubbard, Dylan Hubbard 11/01/2016, 12:24 PM  Dylan Hubbard, M.A. CCC-SLP [phone]

## 2016-11-01 NOTE — Progress Notes (Signed)
ANTICOAGULATION CONSULT NOTE  Pharmacy Consult for Heparin Indication: atrial fibrillation  Allergies  Allergen Reactions  . Penicillins Rash    No other information available at this time    Assessment: 65 y.o. male with h/o Afib, Coumadin on hold, for heparin.  INR subtherapeutic this morning Initial heparin level therapeutic  Goal of Therapy:  Heparin level 0.3-0.7 units/ml Monitor platelets by anticoagulation protocol: Yes   Plan:  Heparin to 1250 units / hr Daily labs  Resume Coumadin? Overall anticoagulation plan?    Patient Measurements: Height: 5\' 7"  (170.2 cm) Weight: 188 lb (85.3 kg) IBW/kg (Calculated) : 66.1  Vital Signs: Temp: 99.1 F (37.3 C) (11/30 1235) Temp Source: Oral (11/30 1235) BP: 106/50 (11/30 1235) Pulse Rate: 112 (11/30 1235)  Labs:  Recent Labs  10/30/16 0411 10/31/16 0310 11/01/16 0530 11/01/16 1435  HGB 9.9* 10.2* 10.8*  --   HCT 30.7* 32.0* 33.5*  --   PLT 274 277 296  --   LABPROT 26.1* 24.0* 18.3*  --   INR 2.34 2.11 1.50  --   HEPARINUNFRC  --   --   --  0.32  CREATININE 4.19* 3.72* 2.94*  --     Estimated Creatinine Clearance: 26.5 mL/min (by C-G formula based on SCr of 2.94 mg/dL (H)).   Thank you Okey RegalLisa Lyniah Fujita, PharmD [phone] 11/01/2016 3:04 PM

## 2016-11-01 NOTE — Progress Notes (Signed)
PROGRESS NOTE    Dylan Hubbard  WJX:[id number] DOB: Nov 30, 1951 DOA: 10/27/2016 PCP: Crist FatVAN EYK, JASON, Dylan Hubbard   Brief Narrative:  65 y/o man PMHx Chronic Systolic CHF (EF 21-30%25-30%), Atrial Fibrillation with RVR on anticoagulation, HTN, HLD, Chronic Respiratory disease (chart carries dx of COPD) with Chronic Hypoxic and Hypercapnic respiratory failure, OSA. DM type II uncontrolled with complication  [facility] on 11/6 with progressively worsening dyspnea. He apparently had stopped his medications (which include lasix). He also had AF with RVR, with an attempt at cardioversion on 11/10 which was unsuccessful. He was rate (but not rhythm) controlled with amio and digoxin, both of which appeared to be new medications for him. He developed fever on 11/17, and CXR demonstrated RLL consolidation. He was initially treated with levofloxacin and vanc, but when his blood culture grew staph epi, it appears that he was narrowed to vanc only. He also had a steadily increasing creatinine from baseline <1 to ~3.0 on presentation to [facility].  He was discharged from [facility] on [facility]/24, and a PICC line was placed on the day of discharge. His wife spoke with him after the procedure, and said he was "out of head" yesterday afternoon because of the sedation he received for the procedure. On the morning of 11/25, he was found to be poorly responsive and EMS was called. Rather than [facility], [facility]. He found to be in acute on chronic hypercarbic respiratory failure. He was started on BiPAP, and after a few hours, he did have some improvement in his mental status and blood gas.    Subjective: 11/30 A/O 2 (does not know where, why). Does not raise the hospital. Negative CP, negative SOB.    Assessment & Plan:   Active Problems:   Respiratory failure (HCC)   Pressure injury of skin   Acute encephalopathy   Acute hypoxemic respiratory failure (HCC)   AKI (acute kidney  injury) (HCC)   Respiratory distress   Swelling of arm  Metabolic Encephalopathy -Multifactorial to include COPD, HCAP, bacteremia,  COPD/HCAP -Complete 5 day course antibiotics -Pulmicort nebulizer BID -Atrovent QID -Xopenex QID -Solu-Medrol 60 mg daily  Acute on chronic hypercarbic / hypoxic respiratory failure -See COPD  Chronic Systolic CHF/Cardiorenal syndrome  -Amiodarone 200 mg daily -Digoxin on hold secondary to renal failure -Lasix 80 mg BID -Strict in and out since admission -1.4 L -Daily weight  Atrial Fibrillation with RVR -See CHF  Pulmonary edema -See CHF  Troponin Leak - likely demand ischemia  Acute renal failure? Unknown baseline Lab Results  Component Value Date   CREATININE 2.94 (H) 11/01/2016   CREATININE 3.72 (H) 10/31/2016   CREATININE 4.19 (H) 10/30/2016  -Improving  Hypokalemia -Potassium goal> 4 -Potassium 50 mEq NGT  Anemia  DM Type 2 -A1c pending -Liver panel pending  Positive culture (Staph Epi) in blood - 1/2 cultures, likely contaminant      DVT prophylaxis: Heparin drip Code Status: Partial Family Communication: None Disposition Plan: SNF   [facility]  [facility]  Procedures/Significant Events:  11/27 echocardiogram:Left ventricle: mildly dilated. -LVEF =40% to 45%. Diffuse hypokinesis.  11/25  started on BiPAP, [facility]  11/27  Remains on bipap  11/29  More alert / awake  VENTILATOR SETTINGS:    Cultures Blood (11/25) x2 >>    Antimicrobials: Vanc started at OSH 11/17 >> Levofloxacin 11/17 >> ?? (~11/20?) Cefepime 11/25 >>   Devices    LINES / TUBES:  Rt PICC placed  at OSH 11/24 >>    Continuous Infusions: . feeding supplement (VITAL AF 1.2 CAL) 1,000 mL (11/01/16 0602)  . heparin 1,200 Units/hr (11/01/16 4098)     Objective: Vitals:   11/01/16 0010 11/01/16 0340 11/01/16 0435 11/01/16 0700  BP:  110/79 110/79 101/66  Pulse: 87 71 96 (!) 103  Resp: 20 20 (!) 22 19  Temp:  97.8 F  (36.6 C)  98.6 F (37 C)  TempSrc:  Axillary  Oral  SpO2: 98% 98% 98% 99%  Weight:  85.3 kg (188 lb)    Height:        Intake/Output Summary (Last 24 hours) at 11/01/16 1191 Last data filed at 11/01/16 4782  Gross per 24 hour  Intake           1157.5 ml  Output             3050 ml  Net          -1892.5 ml   Filed Weights   10/31/16 0500 10/31/16 2123 11/01/16 0340  Weight: 79 kg (174 lb 2.6 oz) 85.7 kg (189 lb) 85.3 kg (188 lb)    Examination:  General: No acute respiratory distress Eyes: negative scleral hemorrhage, negative anisocoria, negative icterus ENT: Negative Runny nose, negative gingival bleeding, Neck:  Negative scars, masses, torticollis, lymphadenopathy, JVD Lungs: Clear to auscultation bilaterally without wheezes or crackles Cardiovascular: Regular rate and rhythm without murmur gallop or rub normal S1 and S2 Abdomen: negative abdominal pain, nondistended, positive soft, bowel sounds, no rebound, no ascites, no appreciable mass Extremities: No significant cyanosis, clubbing, or edema bilateral lower extremities Skin: Negative rashes, lesions, ulcers Psychiatric:  Negative depression, negative anxiety, negative fatigue, negative mania  Central nervous system:  Cranial nerves II through XII intact, tongue/uvula midline, all extremities muscle strength 5/5, sensation intact throughout,negative dysarthria, negative expressive aphasia, negative receptive aphasia.  .     Data Reviewed: Care during the described time interval was provided by me .  I have reviewed this patient's available data, including medical history, events of note, physical examination, and all test results as part of my evaluation. I have personally reviewed and interpreted all radiology studies.  CBC:  Recent Labs Lab 10/27/16 1335 10/27/16 2102 10/28/16 1000 10/30/16 0411 10/31/16 0310 11/01/16 0530  WBC 10.3 9.2 7.1 5.8 7.0 8.9  NEUTROABS 7.9*  --   --   --   --   --   HGB 12.1*  11.6* 10.5* 9.9* 10.2* 10.8*  HCT 38.1* 36.4* 32.5* 30.7* 32.0* 33.5*  MCV 96.2 95.8 93.4 93.3 94.4 94.1  PLT 277 238 270 274 277 296   Basic Metabolic Panel:  Recent Labs Lab 10/28/16 1000 10/29/16 1819 10/30/16 0411 10/30/16 0920 10/30/16 1648 10/31/16 0310 10/31/16 1700 11/01/16 0530  NA 130* 138 140  --   --  142  --  143  K 3.7 3.9 3.6  --   --  3.6  --  3.4*  CL 95* 103 104  --   --  107  --  103  CO2 26 25 28   --   --  28  --  31  GLUCOSE 121* 114* 102*  --   --  194*  --  138*  BUN 24* 34* 33*  --   --  39*  --  45*  CREATININE 3.81* 4.38* 4.19*  --   --  3.72*  --  2.94*  CALCIUM 7.8* 8.3* 8.3*  --   --  8.1*  --  8.1*  MG 2.0  --   --  1.9 1.9 1.8 1.7  --   PHOS 3.7  --   --  4.0 3.6 2.8 2.4*  --    GFR: Estimated Creatinine Clearance: 26.5 mL/min (by C-G formula based on SCr of 2.94 mg/dL (H)). Liver Function Tests:  Recent Labs Lab 10/27/16 1335  AST 17  ALT 14*  ALKPHOS 122  BILITOT 0.5  PROT 4.7*  ALBUMIN 2.2*   No results for input(s): LIPASE, AMYLASE in the last 168 hours. No results for input(s): AMMONIA in the last 168 hours. Coagulation Profile:  Recent Labs Lab 10/28/16 1733 10/29/16 0529 10/30/16 0411 10/31/16 0310 11/01/16 0530  INR 2.49 2.46 2.34 2.11 1.50   Cardiac Enzymes:  Recent Labs Lab 10/27/16 1335 10/28/16 1733 10/28/16 2311 10/29/16 0529  TROPONINI 0.19* 0.14* 0.15* 0.17*   BNP (last 3 results) No results for input(s): PROBNP in the last 8760 hours. HbA1C: No results for input(s): HGBA1C in the last 72 hours. CBG:  Recent Labs Lab 10/31/16 1648 10/31/16 1924 10/31/16 2333 11/01/16 0343 11/01/16 0747  GLUCAP 166* 119* 143* 147* 168*   Lipid Profile: No results for input(s): CHOL, HDL, LDLCALC, TRIG, CHOLHDL, LDLDIRECT in the last 72 hours. Thyroid Function Tests: No results for input(s): TSH, T4TOTAL, FREET4, T3FREE, THYROIDAB in the last 72 hours. Anemia Panel: No results for input(s): VITAMINB12,  FOLATE, FERRITIN, TIBC, IRON, RETICCTPCT in the last 72 hours. Urine analysis:    Component Value Date/Time   COLORURINE YELLOW 10/27/2016 1558   APPEARANCEUR CLOUDY (A) 10/27/2016 1558   LABSPEC 1.015 10/27/2016 1558   PHURINE 5.0 10/27/2016 1558   GLUCOSEU NEGATIVE 10/27/2016 1558   HGBUR NEGATIVE 10/27/2016 1558   BILIRUBINUR NEGATIVE 10/27/2016 1558   KETONESUR NEGATIVE 10/27/2016 1558   PROTEINUR NEGATIVE 10/27/2016 1558   NITRITE NEGATIVE 10/27/2016 1558   LEUKOCYTESUR NEGATIVE 10/27/2016 1558   Sepsis Labs: @LABRCNTIP (procalcitonin:4,lacticidven:4)  ) Recent Results (from the past 240 hour(s))  Culture, blood (routine x 2)     Status: None (Preliminary result)   Collection Time: 10/27/16  8:42 PM  Result Value Ref Range Status   Specimen Description BLOOD RIGHT HAND  Final   Special Requests AEROBIC BOTTLE ONLY  Final   Culture NO GROWTH 3 DAYS  Final   Report Status PENDING  Incomplete  Culture, blood (routine x 2)     Status: None (Preliminary result)   Collection Time: 10/27/16  8:50 PM  Result Value Ref Range Status   Specimen Description BLOOD RIGHT HAND  Final   Special Requests BOTTLES DRAWN AEROBIC AND ANAEROBIC  Final   Culture NO GROWTH 3 DAYS  Final   Report Status PENDING  Incomplete  MRSA PCR Screening     Status: None   Collection Time: 10/27/16  9:48 PM  Result Value Ref Range Status   MRSA by PCR NEGATIVE NEGATIVE Final    Comment:        The GeneXpert MRSA Assay (FDA approved for NASAL specimens only), is one component of a comprehensive MRSA colonization surveillance program. It is not intended to diagnose MRSA infection nor to guide or monitor treatment for MRSA infections.          Radiology Studies: Dg Chest Port 1 View  Result Date: 11/01/2016 CLINICAL DATA:  Shortness of breath . EXAM: PORTABLE CHEST 1 VIEW COMPARISON:  10/31/2016 . FINDINGS: Feeding tube in stable position. Right PICC line stable position. Heart size  stable. Right lower lobe  infiltrate consistent pneumonia noted. Small right pleural effusion cannot be excluded . IMPRESSION: 1. Feeding tube and right PICC line stable position. 2. Right lower lobe infiltrate consistent with pneumonia again noted. No change. Small right pleural effusion again noted. Electronically Signed   By: Maisie Fushomas  Register   On: 11/01/2016 07:40   Dg Chest Port 1 View  Result Date: 10/31/2016 CLINICAL DATA:  Acute respiratory failure with hypoxia. EXAM: PORTABLE CHEST 1 VIEW COMPARISON:  10/30/2016 FINDINGS: PICC line tip near the superior cavoatrial junction. Again noted are densities in the mid and lower right chest with evidence for a right pleural effusion. Left lung is clear. Heart size is normal. Feeding tube extends into the abdomen. The trachea is midline. Negative for a pneumothorax. IMPRESSION: Persistent densities in the right lower chest are suggestive for a combination airspace disease and pleural fluid. Minimal change from the prior examination. Electronically Signed   By: Richarda OverlieAdam  Henn M.D.   On: 10/31/2016 07:57   Dg Abd Portable 1v  Result Date: 10/31/2016 CLINICAL DATA:  Check feeding catheter placement EXAM: PORTABLE ABDOMEN - 1 VIEW COMPARISON:  10/29/2016 FINDINGS: Feeding tube is again identified in the distal aspect of the duodenum near the ligament of Treitz. The overall positioning is relatively stable. Scattered large and small bowel gas is noted. Degenerative change of the lumbar spine is seen. IMPRESSION: Feeding catheter as described. Electronically Signed   By: Alcide CleverMark  Lukens M.D.   On: 10/31/2016 18:39        Scheduled Meds: . amiodarone  200 mg Oral Daily  . aspirin  81 mg Per Tube QPM  . atorvastatin  80 mg Oral QHS  . budesonide (PULMICORT) nebulizer solution  0.5 mg Nebulization BID  . ceFEPime (MAXIPIME) IV  1 g Intravenous Q24H  . chlorhexidine  15 mL Mouth Rinse BID  . furosemide  80 mg Intravenous Q12H  . Gerhardt's butt cream   Topical  TID  . insulin aspart  0-9 Units Subcutaneous Q4H  . ipratropium  0.5 mg Nebulization Q4H  . levalbuterol  0.63 mg Nebulization Q4H  . mouth rinse  15 mL Mouth Rinse q12n4p  . sodium chloride flush  10-40 mL Intracatheter Q12H   Continuous Infusions: . feeding supplement (VITAL AF 1.2 CAL) 1,000 mL (11/01/16 0602)  . heparin 1,200 Units/hr (11/01/16 0652)     LOS: 5 days    Time spent: 40 minutes    Dylan Hubbard, Dylan MessierURTIS Hubbard, Dylan Hubbard [facility] Pager [phone]   If 7PM-7AM, please contact night-coverage [email or web address] Password [email or web address] 11/01/2016, 8:08 AM

## 2016-11-01 NOTE — Clinical Social Work Note (Signed)
Clinical Social Work Assessment  Patient Details  Name: Dylan Hubbard MRN: [id number] Date of Birth: [birth year]  Date of referral:  11/01/16               Reason for consult:  Discharge Planning                Permission sought to share information with:  Facility Medical sales representativeContact Representative, Family Supports Permission granted to share information::  Yes, Verbal Permission Granted  Name::     Dylan Hubbard  Agency::  [facility]  Relationship::  Spouse  Contact Information:  [phone]  Housing/Transportation Living arrangements for the past 2 months:  Skilled Nursing Facility Source of Information:  Medical Team, Spouse Patient Interpreter Needed:  None Criminal Activity/Legal Involvement Pertinent to Current Situation/Hospitalization:  No - Comment as needed Significant Relationships:  Spouse Lives with:  Facility Resident Do you feel safe going back to the place where you live?  Yes Need for family participation in patient care:  Yes (Comment)  Care giving concerns:  [facility]   Social Worker assessment / plan:  Patient not fully oriented. CSW called patient's wife because she was not in the room. CSW introduced role and explained that discharge planning would be discussed. Patient's wife agreeable to patient returning [facility]. He will need PTAR. No further concerns. CSW encouraged patient's wife to contact CSW as needed. CSW will continue to follow patient for support and facilitate discharge back to SNF when medically stable. Patient has not been stable enough for PT evaluation.  Employment status:  Retired Database administratornsurance information:  Managed Medicare PT Recommendations:  Not assessed at this time Information / Referral to community resources:  Skilled Nursing Facility  Patient/Family's Response to care:  Patient not fully oriented. Patient's wife agreeable to return to SNF. Patient's wife supportive and involved in patient's care.  Patient's wife appreciated social work intervention.  Patient/Family's Understanding of and Emotional Response to Diagnosis, Current Treatment, and Prognosis:  Patient not fully oriented. Patient's wife understands and is agreeable to discharge plan. Patient's wife appears happy with hospital care.  Emotional Assessment Appearance:  Appears stated age Attitude/Demeanor/Rapport:  Unable to Assess Affect (typically observed):  Unable to Assess Orientation:  Oriented to Self, Oriented to Place Alcohol / Substance use:  Never Used Psych involvement (Current and /or in the community):  No (Comment)  Discharge Needs  Concerns to be addressed:  Care Coordination Readmission within the last 30 days:  No Current discharge risk:  Cognitively Impaired, Dependent with Mobility Barriers to Discharge:  Continued Medical Work up   Margarito LinerSarah C Amarrion Pastorino, LCSW 11/01/2016, 2:40 PM

## 2016-11-01 NOTE — Progress Notes (Signed)
PT Cancellation Note  Patient Details Name: Dylan ClinesDouglas E Weis MRN: [id number] DOB: May 22, 1951   Cancelled Treatment:    Reason Eval/Treat Not Completed: Patient not medically ready.  Spoke with RN who indicates pt HR up to 140s at rest.  Will hold PT and mobility at this time and f/u as appropriate.     Alison MurrayMegan F Emmalynn Pinkham, PT  [phone] 11/01/2016, 1:40 PM

## 2016-11-01 NOTE — Progress Notes (Signed)
RT NOTE:  Pt has not required BIPAP tonight. Pt is currently wearing 3L [place] and tolerating well. BIPAP is available at bedside if needed. RT will continue to monitor.

## 2016-11-01 NOTE — Consult Note (Signed)
WOC Nurse wound consult note Reason for Consult: new areas of pressure from bipap mask Wound type: Medical Device Related Deep tissue pressure injuries nose and forehead and left temple Also at the time of my assessment, chart reviewed and noted that patient has some possible pressure injuries to the buttocks. However it is noted patient has had very loose stools and is incontinent of urine.  Now patient has external urinary collection device as well as a fecal containment device. Buttocks are red but blanch and are peeling, feel this is related to moisture.  Pressure Ulcer POA: No Measurement: Nose: 3.0cm x 0.7cm x.0.1cm  Forehead: 0.2cm x 1.5cm x 0cm Left temple: 0.2cm x0.2cm x 0.1cm   Wound bed: Nose: 90% dark purple/10% pink/blistered Forehead: 100% dark purple Left temple: 100% reddened but not dark Right upper buttock 100% red Drainage (amount, consistency, odor) none Periwound: intact  Dressing procedure/placement/frequency: Add vaseline to the fascial pressure injuries daily Continue soft silicone foam and barrier cream to the upper buttocks for MASD.  Low air loss mattress in place for moisture management.  Add soft silicone foam to the pressure injuries of the face if patient needs to wear bipap mask again.  WOC Nurse team will follow along with you for weekly wound assessments.  Please notify me of any acute changes in the wounds or any new areas of concerns Rakayla Ricklefs [facility] Aicha Clingenpeel MSN, RN,CWOCN, CNS [phone]

## 2016-11-01 NOTE — Progress Notes (Signed)
Peripherally Inserted Central Catheter/Midline Placement  The IV Nurse has discussed with the patient and/or persons authorized to consent for the patient, the purpose of this procedure and the potential benefits and risks involved with this procedure.  The benefits include less needle sticks, lab draws from the catheter, and the patient may be discharged home with the catheter. Risks include, but not limited to, infection, bleeding, blood clot (thrombus formation), and puncture of an artery; nerve damage and irregular heartbeat and possibility to perform a PICC exchange if needed/ordered by physician.  Alternatives to this procedure were also discussed.  Bard Power PICC patient education guide, fact sheet on infection prevention and patient information card has been provided to patient /or left at bedside.    PICC/Midline Placement Documentation  PICC Single Lumen  (Active)  Indication for Insertion or Continuance of Line Prolonged intravenous therapies;Limited venous access - need for IV therapy >5 days (PICC only) 11/01/2016  8:03 AM  Exposed Catheter (cm) 0 cm 10/31/2016  8:00 AM  Site Assessment Clean;Dry;Intact 11/01/2016  8:03 AM  Line Status Infusing 11/01/2016  8:03 AM  Dressing Type Transparent 11/01/2016  8:03 AM  Dressing Status Clean;Dry;Intact;Antimicrobial disc in place 11/01/2016  8:03 AM  Line Care Cap(s) changed 10/31/2016  6:00 AM  Dressing Intervention New dressing 10/30/2016  7:00 AM  Dressing Change Due 11/06/16 10/31/2016  8:00 PM     PICC Double Lumen 11/01/16 PICC Left Basilic 49 cm 0 cm (Active)  Indication for Insertion or Continuance of Line Prolonged intravenous therapies 11/01/2016  3:00 PM  Exposed Catheter (cm) 0 cm 11/01/2016  3:00 PM  Dressing Change Due 11/08/16 11/01/2016  3:00 PM    Telephone consent signed by Wife   Reginia FortsLumban, Vaniya Augspurger Albarece 11/01/2016, 3:38 PM

## 2016-11-01 NOTE — Progress Notes (Signed)
ANTICOAGULATION CONSULT NOTE  Pharmacy Consult for Heparin Indication: atrial fibrillation  Allergies  Allergen Reactions  . Penicillins Rash    No other information available at this time    Patient Measurements: Height: 5\' 7"  (170.2 cm) Weight: 188 lb (85.3 kg) IBW/kg (Calculated) : 66.1  Vital Signs: Temp: 97.8 F (36.6 C) (11/30 0340) Temp Source: Axillary (11/30 0340) BP: 110/79 (11/30 0435) Pulse Rate: 96 (11/30 0435)  Labs:  Recent Labs  10/30/16 0411 10/31/16 0310 11/01/16 0530  HGB 9.9* 10.2* 10.8*  HCT 30.7* 32.0* 33.5*  PLT 274 277 296  LABPROT 26.1* 24.0* 18.3*  INR 2.34 2.11 1.50  CREATININE 4.19* 3.72* 2.94*    Estimated Creatinine Clearance: 26.5 mL/min (by C-G formula based on SCr of 2.94 mg/dL (H)).  Assessment: 65 y.o. male with h/o Afib, Coumadin on hold, for heparin.  INR subtherapeutic this morning  Goal of Therapy:  Heparin level 0.3-0.7 units/ml Monitor platelets by anticoagulation protocol: Yes   Plan:  Start heparin 1200 units/hr Check heparin level in 8 hours.  Geannie RisenGreg Nijah Tejera, PharmD, BCPS  11/01/2016 6:22 AM

## 2016-11-02 ENCOUNTER — Inpatient Hospital Stay (HOSPITAL_COMMUNITY): Payer: Medicare Other

## 2016-11-02 DIAGNOSIS — I1310 Hypertensive heart and chronic kidney disease without heart failure, with stage 1 through stage 4 chronic kidney disease, or unspecified chronic kidney disease: Secondary | ICD-10-CM

## 2016-11-02 DIAGNOSIS — R197 Diarrhea, unspecified: Secondary | ICD-10-CM

## 2016-11-02 DIAGNOSIS — J189 Pneumonia, unspecified organism: Principal | ICD-10-CM

## 2016-11-02 LAB — COMPREHENSIVE METABOLIC PANEL
ALT: 11 U/L — ABNORMAL LOW (ref 17–63)
ANION GAP: 12 (ref 5–15)
AST: 22 U/L (ref 15–41)
Albumin: 1.9 g/dL — ABNORMAL LOW (ref 3.5–5.0)
Alkaline Phosphatase: 103 U/L (ref 38–126)
BUN: 55 mg/dL — ABNORMAL HIGH (ref 6–20)
CHLORIDE: 102 mmol/L (ref 101–111)
CO2: 28 mmol/L (ref 22–32)
Calcium: 7.9 mg/dL — ABNORMAL LOW (ref 8.9–10.3)
Creatinine, Ser: 2.87 mg/dL — ABNORMAL HIGH (ref 0.61–1.24)
GFR, EST AFRICAN AMERICAN: 25 mL/min — AB (ref >60)
GFR, EST NON AFRICAN AMERICAN: 22 mL/min — AB (ref >60)
Glucose, Bld: 436 mg/dL — ABNORMAL HIGH (ref 65–99)
Potassium: 5.3 mmol/L — ABNORMAL HIGH (ref 3.5–5.1)
SODIUM: 142 mmol/L (ref 135–145)
Total Bilirubin: 0.4 mg/dL (ref 0.3–1.2)
Total Protein: 4.7 g/dL — ABNORMAL LOW (ref 6.5–8.1)

## 2016-11-02 LAB — MAGNESIUM
MAGNESIUM: 1.4 mg/dL — AB (ref 1.7–2.4)
MAGNESIUM: 1.4 mg/dL — AB (ref 1.7–2.4)

## 2016-11-02 LAB — GLUCOSE, CAPILLARY
GLUCOSE-CAPILLARY: 230 mg/dL — AB (ref 65–99)
GLUCOSE-CAPILLARY: 243 mg/dL — AB (ref 65–99)
GLUCOSE-CAPILLARY: 285 mg/dL — AB (ref 65–99)
GLUCOSE-CAPILLARY: 377 mg/dL — AB (ref 65–99)
Glucose-Capillary: 385 mg/dL — ABNORMAL HIGH (ref 65–99)
Glucose-Capillary: 328 mg/dL — ABNORMAL HIGH (ref 65–99)
Glucose-Capillary: 224 mg/dL — ABNORMAL HIGH (ref 65–99)

## 2016-11-02 LAB — BASIC METABOLIC PANEL
ANION GAP: 10 (ref 5–15)
BUN: 54 mg/dL — ABNORMAL HIGH (ref 6–20)
CALCIUM: 8 mg/dL — AB (ref 8.9–10.3)
CO2: 30 mmol/L (ref 22–32)
CREATININE: 2.73 mg/dL — AB (ref 0.61–1.24)
Chloride: 100 mmol/L — ABNORMAL LOW (ref 101–111)
GFR calc non Af Amer: 23 mL/min — ABNORMAL LOW (ref >60)
GFR, EST AFRICAN AMERICAN: 27 mL/min — AB (ref >60)
Glucose, Bld: 436 mg/dL — ABNORMAL HIGH (ref 65–99)
Potassium: 5.1 mmol/L (ref 3.5–5.1)
SODIUM: 140 mmol/L (ref 135–145)

## 2016-11-02 LAB — CULTURE, BLOOD (ROUTINE X 2)
CULTURE: NO GROWTH
Culture: NO GROWTH

## 2016-11-02 LAB — CBC
HEMATOCRIT: 32.5 % — AB (ref 39.0–52.0)
HEMOGLOBIN: 10.2 g/dL — AB (ref 13.0–17.0)
MCH: 29.6 pg (ref 26.0–34.0)
MCHC: 31.4 g/dL (ref 30.0–36.0)
MCV: 94.2 fL (ref 78.0–100.0)
Platelets: 282 10*3/uL (ref 150–400)
RBC: 3.45 MIL/uL — AB (ref 4.22–5.81)
RDW: 14.7 % (ref 11.5–15.5)
WBC: 7.3 10*3/uL (ref 4.0–10.5)

## 2016-11-02 LAB — LIPID PANEL
CHOL/HDL RATIO: 4.2 ratio
CHOLESTEROL: 75 mg/dL (ref 0–200)
CHOLESTEROL: 75 mg/dL (ref 0–200)
HDL: 18 mg/dL — ABNORMAL LOW (ref >40)
HDL: 18 mg/dL — ABNORMAL LOW (ref >40)
LDL Cholesterol: 39 mg/dL (ref 0–99)
LDL Cholesterol: 38 mg/dL (ref 0–99)
TRIGLYCERIDES: 91 mg/dL (ref <150)
Total CHOL/HDL Ratio: 4.2 RATIO
Triglycerides: 96 mg/dL (ref <150)
VLDL: 18 mg/dL (ref 0–40)
VLDL: 19 mg/dL (ref 0–40)

## 2016-11-02 LAB — HEMOGLOBIN A1C
HEMOGLOBIN A1C: 6.7 % — AB (ref 4.8–5.6)
MEAN PLASMA GLUCOSE: 146 mg/dL

## 2016-11-02 LAB — TROPONIN I: TROPONIN I: 0.11 ng/mL — AB (ref <0.03)

## 2016-11-02 LAB — C DIFFICILE QUICK SCREEN W PCR REFLEX
C DIFFICILE (CDIFF) TOXIN: NEGATIVE
C Diff antigen: NEGATIVE
C Diff interpretation: NOT DETECTED

## 2016-11-02 LAB — PROTIME-INR
INR: 1.34
Prothrombin Time: 16.7 seconds — ABNORMAL HIGH (ref 11.4–15.2)

## 2016-11-02 LAB — OCCULT BLOOD X 1 CARD TO LAB, STOOL: Fecal Occult Bld: NEGATIVE

## 2016-11-02 LAB — HEPARIN LEVEL (UNFRACTIONATED): Heparin Unfractionated: 0.46 IU/mL (ref 0.30–0.70)

## 2016-11-02 MED ORDER — INSULIN ASPART 100 UNIT/ML ~~LOC~~ SOLN
0.0000 [IU] | SUBCUTANEOUS | Status: DC
  Administered 2016-11-02: 7 [IU] via SUBCUTANEOUS
  Administered 2016-11-02: 20 [IU] via SUBCUTANEOUS
  Administered 2016-11-02: 15 [IU] via SUBCUTANEOUS
  Administered 2016-11-02 (×2): 7 [IU] via SUBCUTANEOUS
  Administered 2016-11-03: 15 [IU] via SUBCUTANEOUS
  Administered 2016-11-03: 7 [IU] via SUBCUTANEOUS
  Administered 2016-11-03 (×2): 15 [IU] via SUBCUTANEOUS
  Administered 2016-11-03 – 2016-11-04 (×2): 7 [IU] via SUBCUTANEOUS
  Administered 2016-11-04 (×2): 15 [IU] via SUBCUTANEOUS
  Administered 2016-11-04: 7 [IU] via SUBCUTANEOUS
  Administered 2016-11-04 – 2016-11-05 (×3): 4 [IU] via SUBCUTANEOUS

## 2016-11-02 MED ORDER — INSULIN GLARGINE 100 UNIT/ML ~~LOC~~ SOLN
12.0000 [IU] | Freq: Every day | SUBCUTANEOUS | Status: DC
  Administered 2016-11-02 – 2016-11-03 (×2): 12 [IU] via SUBCUTANEOUS
  Filled 2016-11-02 (×2): qty 0.12

## 2016-11-02 MED ORDER — MAGNESIUM SULFATE 2 GM/50ML IV SOLN
2.0000 g | Freq: Once | INTRAVENOUS | Status: AC
  Administered 2016-11-02: 2 g via INTRAVENOUS
  Filled 2016-11-02: qty 50

## 2016-11-02 NOTE — Progress Notes (Signed)
Speech Language Pathology Treatment: Dysphagia  Patient Details Name: Van ClinesDouglas E Mottern MRN: [id number] DOB: 01/24/1951 Today's Date: 11/02/2016 Time: [id number] SLP Time Calculation (min) (ACUTE ONLY): 20 min  Assessment / Plan / Recommendation Clinical Impression  Pt alert and responsive today once repositioned/ seated upright in bed. Oriented to self and location, answered simple biographical questions appropriately. Pt tolerated trials of ice chips and puree consistencies without overt s/s of aspiration but did have intermittent throat clear with trials of thin liquids by straw. Pt continues to be at increased risk of aspiration given continued decreased respiratory status, persistent RLL infiltrate, and AMS although cognitive status may be improved compared to previous date. Recommend proceeding with MBS to objectively evaluate swallow function. Plan for MBS later today.   HPI HPI: 65 yo man with COPD, dCHF, CKD, [facility] with resp `failure, volume overload, A Fib + RVR. Developed RLL HCAP. Was discharged 11/24 but was quickly readmitted with multisystem failure - encephalopathy, combined hypoxic and hypercapneic resp failure, acute on chronic renal failure. He was treated w BiPAP with some stabilization.      SLP Plan  Continue with current plan of care     Recommendations  Diet recommendations: NPO Medication Administration: Via alternative means                Oral Care Recommendations: Oral care QID Follow up Recommendations: Other (comment) (TBD) Plan: Continue with current plan of care       GO                Metro KungOleksiak, Amy K, MA, CCC-SLP 11/02/2016, 9:07 AM [phone]

## 2016-11-02 NOTE — Progress Notes (Signed)
Modified Barium Swallow Progress Note  Patient Details  Name: Dylan Hubbard MRN: [id number] Date of Birth: [birth year]  Today's Date: 11/02/2016  Modified Barium Swallow completed.  Full report located under Chart Review in the Imaging Section.  Brief recommendations include the following:  Clinical Impression  Pt currently with a mild oropharyngeal, sensorimotor dysphagia. Pt with premature spillage to the level of the vallecula and reduced laryngeal elevation resulting in silent penetration to the level of the vocal folds only x1 of thin liquids; cleared when cued pt to cough. Numerous additional trials of thin liquid attempted by cup and straw with no further penetration or aspiration. Pt had mild residuals at the base of tongue/ vallecula across consistencies which were cleared when cued to swallow a 2nd time. Recommend initiating dysphagia 2 diet, thin liquids, meds crushed in puree, full supervision during meals: cue small bites/ sips, cue pt to swallow 2 times. Will continue to follow for diet tolerance/ consider advancement.   Swallow Evaluation Recommendations       SLP Diet Recommendations: Dysphagia 2 (Fine chop) solids;Thin liquid   Liquid Administration via: Cup;Straw   Medication Administration: Crushed with puree   Supervision: Patient able to self feed;Full supervision/cueing for compensatory strategies   Compensations: Slow rate;Small sips/bites;Minimize environmental distractions;Multiple dry swallows after each bite/sip   Postural Changes: Seated upright at 90 degrees;Remain semi-upright after after feeds/meals (Comment)   Oral Care Recommendations: Oral care BID   Other Recommendations: Clarify dietary restrictions    Metro KungOleksiak, Satin Boal K, MA, CCC-SLP 11/02/2016,2:53 PM  [phone]

## 2016-11-02 NOTE — Progress Notes (Signed)
ANTICOAGULATION CONSULT NOTE - Follow Up Consult  Pharmacy Consult for Heparin Indication: atrial fibrillation  Allergies  Allergen Reactions  . Penicillins Rash    No other information available at this time    Patient Measurements: Height: 5\' 7"  (170.2 cm) Weight: 188 lb (85.3 kg) IBW/kg (Calculated) : 66.1 Heparin Dosing Weight: 83.6 kg  Vital Signs: Temp: 98.7 F (37.1 C) (12/01 0700) Temp Source: Oral (12/01 0700) BP: 113/54 (12/01 0700) Pulse Rate: 102 (12/01 0700)  Labs:  Recent Labs  10/31/16 0310 11/01/16 0530 11/01/16 1435 11/02/16 0515  HGB 10.2* 10.8*  --  10.2*  HCT 32.0* 33.5*  --  32.5*  PLT 277 296  --  282  LABPROT 24.0* 18.3*  --  16.7*  INR 2.11 1.50  --  1.34  HEPARINUNFRC  --   --  0.32 0.46  CREATININE 3.72* 2.94*  --  2.87*  2.73*    Estimated Creatinine Clearance: 27.1 mL/min (by C-G formula based on SCr of 2.87 mg/dL (H)).   Medications:  Scheduled:  . amiodarone  200 mg Oral Daily  . aspirin  81 mg Per Tube QPM  . atorvastatin  80 mg Oral QHS  . budesonide (PULMICORT) nebulizer solution  0.5 mg Nebulization BID  . ceFEPime (MAXIPIME) IV  1 g Intravenous Q24H  . chlorhexidine  15 mL Mouth Rinse BID  . Gerhardt's butt cream   Topical TID  . insulin aspart  0-20 Units Subcutaneous Q4H  . ipratropium  0.5 mg Nebulization Q6H  . levalbuterol  0.63 mg Nebulization Q6H  . mouth rinse  15 mL Mouth Rinse q12n4p  . methylPREDNISolone (SOLU-MEDROL) injection  60 mg Intravenous Q24H  . sodium chloride flush  10-40 mL Intracatheter Q12H  . sodium chloride flush  10-40 mL Intracatheter Q12H  . white petrolatum   Topical Daily   Infusions:  . feeding supplement (VITAL AF 1.2 CAL) 1,000 mL (11/02/16 0700)  . heparin 1,250 Units/hr (11/02/16 0700)    Assessment: 65 yo M with hx afib on Coumadin PTA.  Coumadin on hold since admission and pt started on heparin 11/30 when INR fell to <2.  Heparin currently therapeutic on 1250 units/hr.  No  bleeding noted.  Goal of Therapy:  Heparin level 0.3-0.7 units/ml Monitor platelets by anticoagulation protocol: Yes   Plan:  Continue heparin at 1250 units/hr Restart Warfarin? Continue daily heparin level, CBC, and INR  Toys 'R' UsKimberly Shrita Thien, Pharm.D., BCPS Clinical Pharmacist Pager [phone] 11/02/2016 11:50 AM

## 2016-11-02 NOTE — Progress Notes (Signed)
Results for Van ClinesWILSON, Kel E (MRN [id number]) as of 11/02/2016 09:30  Ref. Range 11/02/2016 00:05 11/02/2016 04:03 11/02/2016 08:16  Glucose-Capillary Latest Ref Range: 65 - 99 mg/dL 409230 (H) 811385 (H) 914377 (H)   Noted that CBGs have been greater than 180 mg/dl today.  Recommend starting Lantus 15 units daily if CBGs continue to be elevated and while on steroids. Will continue to monitor blood sugars while in the hospital. Smith MinceKendra Alvis Edgell RN BSN CDE

## 2016-11-02 NOTE — Progress Notes (Signed)
PT Cancellation Note  Patient Details Name: Dylan Hubbard MRN: [id number] DOB: 1951-10-24   Cancelled Treatment:    Reason Eval/Treat Not Completed: Pain limiting ability to participate.  Pt adamantly refusing PT and mobility at this time stating he just hurts "all over".  Will f/u another time.     Aundra MilletMegan F Addie Cederberg 11/02/2016, 1:00 PM

## 2016-11-02 NOTE — Progress Notes (Addendum)
PROGRESS NOTE    Dylan Hubbard  ZOX:[id number] DOB: December 02, 1951 DOA: 10/27/2016 PCP: Crist Fat, MD   Brief Narrative:  65 y/o WM PMHx Chronic Systolic CHF (EF 81-19%), Atrial Fibrillation with RVR on anticoagulation, HTN, HLD, Chronic Respiratory disease (chart carries dx of COPD) with Chronic Hypoxic and Hypercapnic respiratory failure, OSA. DM type II uncontrolled with complication  Presented to [facility] on 11/6 with progressively worsening dyspnea. He apparently had stopped his medications (which include lasix). He also had AF with RVR, with an attempt at cardioversion on 11/10 which was unsuccessful. He was rate (but not rhythm) controlled with amio and digoxin, both of which appeared to be new medications for him. He developed fever on 11/17, and CXR demonstrated RLL consolidation. He was initially treated with levofloxacin and vanc, but when his blood culture grew staph epi, it appears that he was narrowed to vanc only. He also had a steadily increasing creatinine from baseline <1 to ~3.0 on presentation to [facility].  He was discharged from [facility] on 11/24, and a PICC line was placed on the day of discharge. His wife spoke with him after the procedure, and said he was "out of head" yesterday afternoon because of the sedation he received for the procedure. On the morning of 11/25, he was found to be poorly responsive and EMS was called. Rather than re-presenting to [facility], EMS brought him to [facility]. He found to be in acute on chronic hypercarbic respiratory failure. He was started on BiPAP, and after a few hours, he did have some improvement in his mental status and blood gas.    Subjective: 12/1 A/O 2 (does not know where, why). Does know in the hospital. Negative CP, negative SOB.    Assessment & Plan:   Active Problems:   Respiratory failure (HCC)   Pressure injury of skin   Acute encephalopathy   Acute hypoxemic respiratory failure (HCC)   AKI (acute kidney injury)  (HCC)   Respiratory distress   Swelling of arm   Acute respiratory failure with hypoxia (HCC)   Chronic systolic CHF (congestive heart failure) (HCC)   Cardiorenal syndrome   Acute pulmonary edema (HCC)   Atrial fibrillation with RVR (HCC)  Metabolic Encephalopathy -Multifactorial to include COPD, HCAP, bacteremia,  COPD/HCAP -Completed 7 day course antibiotics -Pulmicort nebulizer BID -Atrovent QID -Xopenex QID -Solu-Medrol 30 mg daily  Acute on chronic hypercarbic / hypoxic respiratory failure -See COPD  Pulmonary edema -See CHF  Chronic Systolic CHF/Cardiorenal syndrome  -Amiodarone 200 mg daily -Digoxin on hold secondary to renal failure -Lasix 80 mg BID -Strict in and out since admission -1.6 L -Daily weight Filed Weights   10/31/16 2123 11/01/16 0340 11/02/16 0500  Weight: 85.7 kg (189 lb) 85.3 kg (188 lb) 85.3 kg (188 lb)    Atrial Fibrillation with RVR -See CHF  Demand ischemia Recent Labs Lab 10/27/16 1335 10/28/16 1733 10/28/16 2311 10/29/16 0529  TROPONINI 0.19* 0.14* 0.15* 0.17*  -Continue to trend -Most likely combination of hypoxic respiratory failure, A. fib with RVR, renal failure.   Acute renal failure? Unknown baseline Lab Results  Component Value Date   CREATININE 2.73 (H) 11/02/2016   CREATININE 2.94 (H) 11/01/2016   CREATININE 3.72 (H) 10/31/2016  -Improving  Hypokalemia -Potassium goal> 4 -Potassium 50 mEq NGT  Hypomagnesemia -Magnesium goal> 2 -Magnesium IV 2 g  Anemia  DM Type 2 -A1c pending -Lipid panel pending -12/1 Start Lantus 12 units -Resistant SSI  Positive culture (Staph Epi) in blood -  1/2 cultures, likely contaminant  -At outside hospital, however blood culture here negative  Diarrhea -May be secondary to tube feeds. However patient has been on multiple antibiotics which were started at outside hospitals will check for infective cause    DVT prophylaxis: Heparin drip Code Status: Partial Family  Communication: None Disposition Plan: SNF   Consultants:  [facility] M  Procedures/Significant Events:  11/27 echocardiogram:Left ventricle: mildly dilated. -LVEF =40% to 45%. Diffuse hypokinesis.  11/25  started on BiPAP, tx to [facility]  11/27  Remains on bipap  11/29  More alert / awake  VENTILATOR SETTINGS:    Cultures 11/25 blood 2 negative 11/25 MRSA by PCR negative    12/1 fecal lactoferrin pending 12/1 gastrointestinal panel pending 12/1 C. difficile pending   Antimicrobials: Vanc started at OSH 11/17 >> Levofloxacin 11/17 >> ?? (~11/20?) Cefepime 11/25 >> 12/1   Devices    LINES / TUBES:  Rt PICC placed at OSH 11/24 >>    Continuous Infusions: . feeding supplement (VITAL AF 1.2 CAL) 1,000 mL (11/01/16 0602)  . heparin 1,250 Units/hr (11/02/16 0055)     Objective: Vitals:   11/02/16 0007 11/02/16 0402 11/02/16 0500 11/02/16 0700  BP: 120/63 (!) 100/46  (!) 113/54  Pulse: (!) 101 93  (!) 102  Resp: (!) 23 16  19   Temp: 99 F (37.2 C) 98.7 F (37.1 C)  98.7 F (37.1 C)  TempSrc: Oral Axillary  Oral  SpO2: 95% 98%  98%  Weight:   85.3 kg (188 lb)   Height:        Intake/Output Summary (Last 24 hours) at 11/02/16 0813 Last data filed at 11/02/16 0700  Gross per 24 hour  Intake          2384.73 ml  Output             2275 ml  Net           109.73 ml   Filed Weights   10/31/16 2123 11/01/16 0340 11/02/16 0500  Weight: 85.7 kg (189 lb) 85.3 kg (188 lb) 85.3 kg (188 lb)    Examination:  General:A/O 2 (does not know where, why). Does know in the hospital, No acute respiratory distress Eyes: negative scleral hemorrhage, negative anisocoria, negative icterus ENT: Negative Runny nose, negative gingival bleeding, Neck:  Negative scars, masses, torticollis, lymphadenopathy, JVD Lungs: Clear to auscultation bilaterally without wheezes or crackles Cardiovascular: Regular rate and rhythm without murmur gallop or rub normal S1 and S2 Abdomen: negative  abdominal pain, nondistended, positive soft, bowel sounds, no rebound, no ascites, no appreciable mass Extremities: No significant cyanosis, clubbing, or edema bilateral lower extremities Skin: Negative rashes, lesions, ulcers Psychiatric:  Negative depression, negative anxiety, negative fatigue, negative mania  Central nervous system:  Cranial nerves II through XII intact, tongue/uvula midline, all extremities muscle strength 5/5, sensation intact throughout,negative dysarthria, negative expressive aphasia, negative receptive aphasia.  .     Data Reviewed: Care during the described time interval was provided by me .  I have reviewed this patient's available data, including medical history, events of note, physical examination, and all test results as part of my evaluation. I have personally reviewed and interpreted all radiology studies.  CBC:  Recent Labs Lab 10/27/16 1335  10/28/16 1000 10/30/16 0411 10/31/16 0310 11/01/16 0530 11/02/16 0515  WBC 10.3  < > 7.1 5.8 7.0 8.9 7.3  NEUTROABS 7.9*  --   --   --   --   --   --  HGB 12.1*  < > 10.5* 9.9* 10.2* 10.8* 10.2*  HCT 38.1*  < > 32.5* 30.7* 32.0* 33.5* 32.5*  MCV 96.2  < > 93.4 93.3 94.4 94.1 94.2  PLT 277  < > 270 274 277 296 282  < > = values in this interval not displayed. Basic Metabolic Panel:  Recent Labs Lab 10/28/16 1000 10/29/16 1819 10/30/16 0411 10/30/16 0920 10/30/16 1648 10/31/16 0310 10/31/16 1700 11/01/16 0530 11/02/16 0515  NA 130* 138 140  --   --  142  --  143 140  K 3.7 3.9 3.6  --   --  3.6  --  3.4* 5.1  CL 95* 103 104  --   --  107  --  103 100*  CO2 26 25 28   --   --  28  --  31 30  GLUCOSE 121* 114* 102*  --   --  194*  --  138* 436*  BUN 24* 34* 33*  --   --  39*  --  45* 54*  CREATININE 3.81* 4.38* 4.19*  --   --  3.72*  --  2.94* 2.73*  CALCIUM 7.8* 8.3* 8.3*  --   --  8.1*  --  8.1* 8.0*  MG 2.0  --   --  1.9 1.9 1.8 1.7  --  1.4*  PHOS 3.7  --   --  4.0 3.6 2.8 2.4*  --   --     GFR: Estimated Creatinine Clearance: 28.5 mL/min (by C-G formula based on SCr of 2.73 mg/dL (H)). Liver Function Tests:  Recent Labs Lab 10/27/16 1335  AST 17  ALT 14*  ALKPHOS 122  BILITOT 0.5  PROT 4.7*  ALBUMIN 2.2*   No results for input(s): LIPASE, AMYLASE in the last 168 hours. No results for input(s): AMMONIA in the last 168 hours. Coagulation Profile:  Recent Labs Lab 10/29/16 0529 10/30/16 0411 10/31/16 0310 11/01/16 0530 11/02/16 0515  INR 2.46 2.34 2.11 1.50 1.34   Cardiac Enzymes:  Recent Labs Lab 10/27/16 1335 10/28/16 1733 10/28/16 2311 10/29/16 0529  TROPONINI 0.19* 0.14* 0.15* 0.17*   BNP (last 3 results) No results for input(s): PROBNP in the last 8760 hours. HbA1C: No results for input(s): HGBA1C in the last 72 hours. CBG:  Recent Labs Lab 11/01/16 1211 11/01/16 1609 11/01/16 2039 11/02/16 0005 11/02/16 0403  GLUCAP 180* 181* 176* 230* 385*   Lipid Profile:  Recent Labs  11/02/16 0515  CHOL 75  HDL 18*  LDLCALC 39  TRIG 91  CHOLHDL 4.2   Thyroid Function Tests: No results for input(s): TSH, T4TOTAL, FREET4, T3FREE, THYROIDAB in the last 72 hours. Anemia Panel: No results for input(s): VITAMINB12, FOLATE, FERRITIN, TIBC, IRON, RETICCTPCT in the last 72 hours. Urine analysis:    Component Value Date/Time   COLORURINE YELLOW 10/27/2016 1558   APPEARANCEUR CLOUDY (A) 10/27/2016 1558   LABSPEC 1.015 10/27/2016 1558   PHURINE 5.0 10/27/2016 1558   GLUCOSEU NEGATIVE 10/27/2016 1558   HGBUR NEGATIVE 10/27/2016 1558   BILIRUBINUR NEGATIVE 10/27/2016 1558   KETONESUR NEGATIVE 10/27/2016 1558   PROTEINUR NEGATIVE 10/27/2016 1558   NITRITE NEGATIVE 10/27/2016 1558   LEUKOCYTESUR NEGATIVE 10/27/2016 1558   Sepsis Labs: @LABRCNTIP (procalcitonin:4,lacticidven:4)  ) Recent Results (from the past 240 hour(s))  Culture, blood (routine x 2)     Status: None (Preliminary result)   Collection Time: 10/27/16  8:42 PM  Result  Value Ref Range Status   Specimen Description BLOOD RIGHT HAND  Final   Special Requests AEROBIC BOTTLE ONLY  Final   Culture NO GROWTH 4 DAYS  Final   Report Status PENDING  Incomplete  Culture, blood (routine x 2)     Status: None (Preliminary result)   Collection Time: 10/27/16  8:50 PM  Result Value Ref Range Status   Specimen Description BLOOD RIGHT HAND  Final   Special Requests BOTTLES DRAWN AEROBIC AND ANAEROBIC  Final   Culture NO GROWTH 4 DAYS  Final   Report Status PENDING  Incomplete  MRSA PCR Screening     Status: None   Collection Time: 10/27/16  9:48 PM  Result Value Ref Range Status   MRSA by PCR NEGATIVE NEGATIVE Final    Comment:        The GeneXpert MRSA Assay (FDA approved for NASAL specimens only), is one component of a comprehensive MRSA colonization surveillance program. It is not intended to diagnose MRSA infection nor to guide or monitor treatment for MRSA infections.          Radiology Studies: Dg Chest Port 1 View  Result Date: 11/01/2016 CLINICAL DATA:  LEFT arm PICC line placement EXAM: PORTABLE CHEST 1 VIEW COMPARISON:  Portable exam 1547 hours compared to 0701 hours FINDINGS: BILATERAL upper extremity PICC lines with tips projecting over SVC. Feeding tube extends to at least the inferior mediastinum. Normal heart size and mediastinal contours. Mild pulmonary vascular congestion. Persistent RIGHT lower lobe infiltrate. Remaining lungs clear. No pleural effusion or pneumothorax. Mild central peribronchial thickening again identified. Atherosclerotic calcification aorta. IMPRESSION: Tip of BILATERAL upper extremity PICC lines project over SVC above cavoatrial junction. Bronchitic changes with persistent RIGHT lower lobe infiltrate. Aortic atherosclerosis. Electronically Signed   By: Ulyses Southward M.D.   On: 11/01/2016 16:00   Dg Chest Port 1 View  Result Date: 11/01/2016 CLINICAL DATA:  Shortness of breath . EXAM: PORTABLE CHEST 1 VIEW  COMPARISON:  10/31/2016 . FINDINGS: Feeding tube in stable position. Right PICC line stable position. Heart size stable. Right lower lobe infiltrate consistent pneumonia noted. Small right pleural effusion cannot be excluded . IMPRESSION: 1. Feeding tube and right PICC line stable position. 2. Right lower lobe infiltrate consistent with pneumonia again noted. No change. Small right pleural effusion again noted. Electronically Signed   By: Maisie Fus  Register   On: 11/01/2016 07:40   Dg Abd Portable 1v  Result Date: 10/31/2016 CLINICAL DATA:  Check feeding catheter placement EXAM: PORTABLE ABDOMEN - 1 VIEW COMPARISON:  10/29/2016 FINDINGS: Feeding tube is again identified in the distal aspect of the duodenum near the ligament of Treitz. The overall positioning is relatively stable. Scattered large and small bowel gas is noted. Degenerative change of the lumbar spine is seen. IMPRESSION: Feeding catheter as described. Electronically Signed   By: Alcide Clever M.D.   On: 10/31/2016 18:39        Scheduled Meds: . amiodarone  200 mg Oral Daily  . aspirin  81 mg Per Tube QPM  . atorvastatin  80 mg Oral QHS  . budesonide (PULMICORT) nebulizer solution  0.5 mg Nebulization BID  . ceFEPime (MAXIPIME) IV  1 g Intravenous Q24H  . chlorhexidine  15 mL Mouth Rinse BID  . Gerhardt's butt cream   Topical TID  . insulin aspart  0-9 Units Subcutaneous Q4H  . ipratropium  0.5 mg Nebulization Q6H  . levalbuterol  0.63 mg Nebulization Q6H  . mouth rinse  15 mL Mouth Rinse q12n4p  . methylPREDNISolone (SOLU-MEDROL) injection  60 mg Intravenous Q24H  . sodium chloride flush  10-40 mL Intracatheter Q12H  . sodium chloride flush  10-40 mL Intracatheter Q12H  . white petrolatum   Topical Daily   Continuous Infusions: . feeding supplement (VITAL AF 1.2 CAL) 1,000 mL (11/01/16 0602)  . heparin 1,250 Units/hr (11/02/16 0055)     LOS: 6 days    Time spent: 40 minutes    WOODS, Roselind MessierURTIS J, MD [facility] Pager [phone]   If 7PM-7AM, please contact night-coverage [email or web address] Password [email or web address] 11/02/2016, 8:13 AM

## 2016-11-03 DIAGNOSIS — R197 Diarrhea, unspecified: Secondary | ICD-10-CM

## 2016-11-03 DIAGNOSIS — J189 Pneumonia, unspecified organism: Secondary | ICD-10-CM

## 2016-11-03 DIAGNOSIS — E118 Type 2 diabetes mellitus with unspecified complications: Secondary | ICD-10-CM

## 2016-11-03 DIAGNOSIS — G9341 Metabolic encephalopathy: Secondary | ICD-10-CM

## 2016-11-03 DIAGNOSIS — J431 Panlobular emphysema: Secondary | ICD-10-CM

## 2016-11-03 DIAGNOSIS — I248 Other forms of acute ischemic heart disease: Secondary | ICD-10-CM

## 2016-11-03 LAB — GASTROINTESTINAL PANEL BY PCR, STOOL (REPLACES STOOL CULTURE)

## 2016-11-03 LAB — GLUCOSE, CAPILLARY
GLUCOSE-CAPILLARY: 316 mg/dL — AB (ref 65–99)
Glucose-Capillary: 224 mg/dL — ABNORMAL HIGH (ref 65–99)
Glucose-Capillary: 346 mg/dL — ABNORMAL HIGH (ref 65–99)
Glucose-Capillary: 304 mg/dL — ABNORMAL HIGH (ref 65–99)
Glucose-Capillary: 204 mg/dL — ABNORMAL HIGH (ref 65–99)

## 2016-11-03 LAB — PROTIME-INR
INR: 1.33
Prothrombin Time: 16.6 seconds — ABNORMAL HIGH (ref 11.4–15.2)

## 2016-11-03 LAB — BASIC METABOLIC PANEL
Anion gap: 9 (ref 5–15)
BUN: 70 mg/dL — ABNORMAL HIGH (ref 6–20)
CALCIUM: 8.1 mg/dL — AB (ref 8.9–10.3)
CO2: 30 mmol/L (ref 22–32)
CREATININE: 2.3 mg/dL — AB (ref 0.61–1.24)
Chloride: 102 mmol/L (ref 101–111)
GFR, EST AFRICAN AMERICAN: 33 mL/min — AB (ref >60)
GFR, EST NON AFRICAN AMERICAN: 28 mL/min — AB (ref >60)
GLUCOSE: 349 mg/dL — AB (ref 65–99)
Potassium: 4.4 mmol/L (ref 3.5–5.1)
Sodium: 141 mmol/L (ref 135–145)

## 2016-11-03 LAB — MAGNESIUM: MAGNESIUM: 2.1 mg/dL (ref 1.7–2.4)

## 2016-11-03 LAB — CBC
HCT: 30.6 % — ABNORMAL LOW (ref 39.0–52.0)
Hemoglobin: 9.9 g/dL — ABNORMAL LOW (ref 13.0–17.0)
MCH: 30.1 pg (ref 26.0–34.0)
MCHC: 32.4 g/dL (ref 30.0–36.0)
MCV: 93 fL (ref 78.0–100.0)
Platelets: 351 10*3/uL (ref 150–400)
RBC: 3.29 MIL/uL — ABNORMAL LOW (ref 4.22–5.81)
RDW: 14.8 % (ref 11.5–15.5)
WBC: 14.7 10*3/uL — ABNORMAL HIGH (ref 4.0–10.5)

## 2016-11-03 LAB — HEPARIN LEVEL (UNFRACTIONATED)
HEPARIN UNFRACTIONATED: 0.28 [IU]/mL — AB (ref 0.30–0.70)
HEPARIN UNFRACTIONATED: 0.67 [IU]/mL (ref 0.30–0.70)

## 2016-11-03 LAB — TROPONIN I
Troponin I: 0.14 ng/mL (ref <0.03)
Troponin I: 0.13 ng/mL (ref <0.03)

## 2016-11-03 MED ORDER — METHYLPREDNISOLONE SODIUM SUCC 40 MG IJ SOLR
30.0000 mg | INTRAMUSCULAR | Status: DC
  Administered 2016-11-03: 30 mg via INTRAVENOUS
  Filled 2016-11-03: qty 1

## 2016-11-03 MED ORDER — ONDANSETRON HCL 4 MG/2ML IJ SOLN
4.0000 mg | Freq: Four times a day (QID) | INTRAMUSCULAR | Status: DC | PRN
  Administered 2016-11-03 – 2016-11-12 (×3): 4 mg via INTRAVENOUS
  Filled 2016-11-03 (×3): qty 2

## 2016-11-03 MED ORDER — INSULIN ASPART 100 UNIT/ML ~~LOC~~ SOLN
8.0000 [IU] | Freq: Three times a day (TID) | SUBCUTANEOUS | Status: DC
  Administered 2016-11-03: 8 [IU] via SUBCUTANEOUS

## 2016-11-03 MED ORDER — INSULIN GLARGINE 100 UNIT/ML ~~LOC~~ SOLN
16.0000 [IU] | Freq: Every day | SUBCUTANEOUS | Status: DC
  Administered 2016-11-04 – 2016-11-05 (×2): 16 [IU] via SUBCUTANEOUS
  Filled 2016-11-03 (×3): qty 0.16

## 2016-11-03 NOTE — Progress Notes (Signed)
ANTICOAGULATION CONSULT NOTE - Follow Up Consult  Pharmacy Consult for Heparin Indication: atrial fibrillation  Allergies  Allergen Reactions  . Penicillins Rash    No other information available at this time    Patient Measurements: Height: 5\' 7"  (170.2 cm) Weight:  (bed scale malfunction) IBW/kg (Calculated) : 66.1 Heparin Dosing Weight: 83.6 kg  Vital Signs: Temp: 98.8 F (37.1 C) (12/02 1530) Temp Source: Oral (12/02 1530) BP: 128/52 (12/02 1530) Pulse Rate: 95 (12/02 1530)  Labs:  Recent Labs  11/01/16 0530  11/02/16 0515 11/02/16 2041 11/03/16 0121 11/03/16 0721 11/03/16 1400  HGB 10.8*  --  10.2*  --  9.9*  --   --   HCT 33.5*  --  32.5*  --  30.6*  --   --   PLT 296  --  282  --  351  --   --   LABPROT 18.3*  --  16.7*  --  16.6*  --   --   INR 1.50  --  1.34  --  1.33  --   --   HEPARINUNFRC  --   < > 0.46  --  0.28*  --  0.67  CREATININE 2.94*  --  2.87*  2.73*  --   --  2.30*  --   TROPONINI  --   --   --  0.11* 0.14* 0.13*  --   < > = values in this interval not displayed.  Estimated Creatinine Clearance: 33.9 mL/min (by C-G formula based on SCr of 2.3 mg/dL (H)).   Medications:  Infusions:  . feeding supplement (VITAL AF 1.2 CAL) 1,000 mL (11/03/16 1300)  . heparin 1,400 Units/hr (11/03/16 1300)    Assessment: [age] yo M with hx afib on Coumadin PTA. Coumadin on hold since admission and pt started on heparin 11/30 when INR fell to <2. Heparin currently therapeutic on 1400 units/hr after rate adjustment this morning. No bleeding noted.  Goal of Therapy:  Heparin level 0.3-0.7 units/ml Monitor platelets by anticoagulation protocol: Yes   Plan:  Continue heparin at 1400 units/hr Continue daily heparin level, CBC  Lysle Pearlachel Erial Fikes, PharmD, BCPS Pager # [phone] 11/03/2016 3:46 PM

## 2016-11-03 NOTE — Progress Notes (Signed)
ANTICOAGULATION CONSULT NOTE - Follow Up Consult  Pharmacy Consult for heparin Indication: atrial fibrillation  Labs:  Recent Labs  11/01/16 0530 11/01/16 1435 11/02/16 0515 11/02/16 2041 11/03/16 0121  HGB 10.8*  --  10.2*  --  9.9*  HCT 33.5*  --  32.5*  --  30.6*  PLT 296  --  282  --  351  LABPROT 18.3*  --  16.7*  --  16.6*  INR 1.50  --  1.34  --  1.33  HEPARINUNFRC  --  0.32 0.46  --  0.28*  CREATININE 2.94*  --  2.87*  2.73*  --   --   TROPONINI  --   --   --  0.11* 0.14*    Assessment: 65yo male now subtherapeutic on heparin after two levels at goal; no gtt issues per RN.  Goal of Therapy:  Heparin level 0.3-0.7 units/ml   Plan:  Will increase heparin gtt by 1-2 units/kg/hr to 1400 units/hr and check level in 8hr.  Dylan Hubbard, PharmD, BCPS  11/03/2016,5:43 AM

## 2016-11-03 NOTE — Progress Notes (Addendum)
PROGRESS NOTE    Dylan Hubbard  ZOX:[id number] DOB: 03-Apr-1951 DOA: 10/27/2016 PCP: Crist Fat, MD   Brief Narrative:  65 y/o WM PMHx Chronic Systolic CHF (EF 81-19%), Atrial Fibrillation with RVR on anticoagulation, HTN, HLD, Chronic Respiratory disease (chart carries dx of COPD) with Chronic Hypoxic and Hypercapnic respiratory failure, OSA. DM type II uncontrolled with complication  Presented to [facility] on 11/6 with progressively worsening dyspnea. He apparently had stopped his medications (which include lasix). He also had AF with RVR, with an attempt at cardioversion on 11/10 which was unsuccessful. He was rate (but not rhythm) controlled with amio and digoxin, both of which appeared to be new medications for him. He developed fever on 11/17, and CXR demonstrated RLL consolidation. He was initially treated with levofloxacin and vanc, but when his blood culture grew staph epi, it appears that he was narrowed to vanc only. He also had a steadily increasing creatinine from baseline <1 to ~3.0 on presentation to [facility].  He was discharged from [facility] on 11/24, and a PICC line was placed on the day of discharge. His wife spoke with him after the procedure, and said he was "out of head" yesterday afternoon because of the sedation he received for the procedure. On the morning of 11/25, he was found to be poorly responsive and EMS was called. Rather than re-presenting to [facility], EMS brought him to [facility]. He found to be in acute on chronic hypercarbic respiratory failure. He was started on BiPAP, and after a few hours, he did have some improvement in his mental status and blood gas.    Subjective: 12/2  A/O 3 (does not know why). Does know in the hospital. Negative CP, negative SOB. States set on the edge of the bed with PT today but felt extremely weak. Is willing to attempt to sit in chair.     Assessment & Plan:   Active Problems:   Respiratory failure (HCC)   Pressure  injury of skin   Acute encephalopathy   Acute hypoxemic respiratory failure (HCC)   AKI (acute kidney injury) (HCC)   Respiratory distress   Swelling of arm   Acute respiratory failure with hypoxia (HCC)   Chronic systolic CHF (congestive heart failure) (HCC)   Cardiorenal syndrome   Acute pulmonary edema (HCC)   Atrial fibrillation with RVR (HCC)   HCAP (healthcare-associated pneumonia)   Diarrhea  Metabolic Encephalopathy -Multifactorial to include COPD, HCAP, bacteremia,  COPD/HCAP -Completed 7 day course antibiotics -Pulmicort nebulizer BID -Atrovent QID -Xopenex QID -Solu-Medrol 30 mg daily  Acute on chronic hypercarbic / hypoxic respiratory failure -See COPD  Pulmonary edema -See CHF  Chronic Systolic CHF/Cardiorenal syndrome  -Amiodarone 200 mg daily -Digoxin on hold secondary to renal failure -Lasix 80 mg BID -Strict in and out since admission - -Daily weight Filed Weights   10/31/16 2123 11/01/16 0340 11/02/16 0500  Weight: 85.7 kg (189 lb) 85.3 kg (188 lb) 85.3 kg (188 lb)    Atrial Fibrillation with RVR -See CHF  Demand ischemia  Recent Labs Lab 10/27/16 1335 10/28/16 1733 10/28/16 2311 10/29/16 0529 11/02/16 2041 11/03/16 0121  TROPONINI 0.19* 0.14* 0.15* 0.17* 0.11* 0.14*  -Continue to trend -Most likely combination of hypoxic respiratory failure, A. fib with RVR, renal failure.   Acute renal failure? Unknown baseline Lab Results  Component Value Date   CREATININE 2.30 (H) 11/03/2016   CREATININE 2.87 (H) 11/02/2016   CREATININE 2.73 (H) 11/02/2016  -Improving  Hypokalemia -Potassium goal> 4 -  Potassium 50 mEq NGT  Hypomagnesemia -Magnesium goal> 2 -Magnesium IV 2 g  Anemia  DM Type 2 Controlled With complication -12/1 Hemoglobin A1c= 6.7 -Lipid panel: Within ADA guidelines -12/2 increase Lantus 16 units -start NovoLog 8 units  -Resistant SSI  Positive culture (Staph Epi) in blood - 1/2 cultures, likely  contaminant  -At outside hospital, however blood culture here negative  Diarrhea -May be secondary to tube feeds. However patient has been on multiple antibiotics which were started at outside hospitals will check for infective cause    DVT prophylaxis: Heparin drip Code Status: Partial Family Communication: None Disposition Plan: SNF   Cons[facility]  [facility]  Procedures/Significant Events:  11/27 echocardiogram:Left ventricle: mildly dilated. -LVEF =40% to 45%. Diffuse hypokinesis.  11/25  started on BiPAP, [facility]  11/27  Remains on bipap  11/29  More alert / awake  VENTILATOR SETTINGS:    Cultures 11/25 blood 2 negative 11/25 MRSA by PCR negative    12/1 fecal lactoferrin pending 12/1 gastrointestinal panel pending 12/1 C. difficile panel negative   Antimicrobials: Vanc started at OSH 11/17 >> Levofloxacin 11/17 >> ?? (~11/20?) Cefepime 11/25 >> 12/1   Devices    LINES / TUBES:  Rt PICC placed at OSH 11/24 >>    Continuous Infusions: . feeding supplement (VITAL AF 1.2 CAL) 1,000 mL (11/03/16 0543)  . heparin 1,400 Units/hr (11/03/16 0543)     Objective: Vitals:   11/02/16 2342 11/03/16 0307 11/03/16 0351 11/03/16 0750  BP: (!) 143/51  (!) 120/52 134/61  Pulse: 96  94 93  Resp: 15  18 15   Temp: 98.2 F (36.8 C)  97.7 F (36.5 C) 98.4 F (36.9 C)  TempSrc: Oral  Oral Oral  SpO2: 98% 96% 98% 97%  Weight:      Height:        Intake/Output Summary (Last 24 hours) at 11/03/16 0818 Last data filed at 11/03/16 0532  Gross per 24 hour  Intake          1022.83 ml  Output             1050 ml  Net           -27.17 ml   Filed Weights   10/31/16 2123 11/01/16 0340 11/02/16 0500  Weight: 85.7 kg (189 lb) 85.3 kg (188 lb) 85.3 kg (188 lb)    Examination:  General:A/O 2 (does not know where, why). Does know in the hospital, No acute respiratory distress Eyes: negative scleral hemorrhage, negative anisocoria, negative icterus ENT: Negative  Runny nose, negative gingival bleeding, Neck:  Negative scars, masses, torticollis, lymphadenopathy, JVD Lungs: Clear to auscultation bilaterally without wheezes or crackles Cardiovascular: Regular rate and rhythm without murmur gallop or rub normal S1 and S2 Abdomen: negative abdominal pain, nondistended, positive soft, bowel sounds, no rebound, no ascites, no appreciable mass Extremities: No significant cyanosis, clubbing, or edema bilateral lower extremities Skin: Negative rashes, lesions, ulcers Psychiatric:  Negative depression, negative anxiety, negative fatigue, negative mania  Central nervous system:  Cranial nerves II through XII intact, tongue/uvula midline, all extremities muscle strength 5/5, sensation intact throughout,negative dysarthria, negative expressive aphasia, negative receptive aphasia.  .     Data Reviewed: Care during the described time interval was provided by me .  I have reviewed this patient's available data, including medical history, events of note, physical examination, and all test results as part of my evaluation. I have personally reviewed and interpreted all radiology studies.  CBC:  Recent  Labs Lab 10/27/16 1335  10/30/16 0411 10/31/16 0310 11/01/16 0530 11/02/16 0515 11/03/16 0121  WBC 10.3  < > 5.8 7.0 8.9 7.3 14.7*  NEUTROABS 7.9*  --   --   --   --   --   --   HGB 12.1*  < > 9.9* 10.2* 10.8* 10.2* 9.9*  HCT 38.1*  < > 30.7* 32.0* 33.5* 32.5* 30.6*  MCV 96.2  < > 93.3 94.4 94.1 94.2 93.0  PLT 277  < > 274 277 296 282 351  < > = values in this interval not displayed. Basic Metabolic Panel:  Recent Labs Lab 10/28/16 1000 10/29/16 1819 10/30/16 0411 10/30/16 0920 10/30/16 1648 10/31/16 0310 10/31/16 1700 11/01/16 0530 11/02/16 0515 11/03/16 0121  NA 130* 138 140  --   --  142  --  143 142  140  --   K 3.7 3.9 3.6  --   --  3.6  --  3.4* 5.3*  5.1  --   CL 95* 103 104  --   --  107  --  103 102  100*  --   CO2 26 25 28   --   --   28  --  31 28  30   --   GLUCOSE 121* 114* 102*  --   --  194*  --  138* 436*  436*  --   BUN 24* 34* 33*  --   --  39*  --  45* 55*  54*  --   CREATININE 3.81* 4.38* 4.19*  --   --  3.72*  --  2.94* 2.87*  2.73*  --   CALCIUM 7.8* 8.3* 8.3*  --   --  8.1*  --  8.1* 7.9*  8.0*  --   MG 2.0  --   --  1.9 1.9 1.8 1.7  --  1.4*  1.4* 2.1  PHOS 3.7  --   --  4.0 3.6 2.8 2.4*  --   --   --    GFR: Estimated Creatinine Clearance: 27.1 mL/min (by C-G formula based on SCr of 2.87 mg/dL (H)). Liver Function Tests:  Recent Labs Lab 10/27/16 1335 11/02/16 0515  AST 17 22  ALT 14* 11*  ALKPHOS 122 103  BILITOT 0.5 0.4  PROT 4.7* 4.7*  ALBUMIN 2.2* 1.9*   No results for input(s): LIPASE, AMYLASE in the last 168 hours. No results for input(s): AMMONIA in the last 168 hours. Coagulation Profile:  Recent Labs Lab 10/30/16 0411 10/31/16 0310 11/01/16 0530 11/02/16 0515 11/03/16 0121  INR 2.34 2.11 1.50 1.34 1.33   Cardiac Enzymes:  Recent Labs Lab 10/28/16 1733 10/28/16 2311 10/29/16 0529 11/02/16 2041 11/03/16 0121  TROPONINI 0.14* 0.15* 0.17* 0.11* 0.14*   BNP (last 3 results) No results for input(s): PROBNP in the last 8760 hours. HbA1C:  Recent Labs  11/02/16 0515  HGBA1C 6.7*   CBG:  Recent Labs Lab 11/02/16 1553 11/02/16 1958 11/02/16 2341 11/03/16 0356 11/03/16 0748  GLUCAP 243* 224* 285* 224* 316*   Lipid Profile:  Recent Labs  11/02/16 0515  CHOL 75  75  HDL 18*  18*  LDLCALC 38  39  TRIG 96  91  CHOLHDL 4.2  4.2   Thyroid Function Tests: No results for input(s): TSH, T4TOTAL, FREET4, T3FREE, THYROIDAB in the last 72 hours. Anemia Panel: No results for input(s): VITAMINB12, FOLATE, FERRITIN, TIBC, IRON, RETICCTPCT in the last 72 hours. Urine analysis:    Component Value Date/Time  COLORURINE YELLOW 10/27/2016 1558   APPEARANCEUR CLOUDY (A) 10/27/2016 1558   LABSPEC 1.015 10/27/2016 1558   PHURINE 5.0 10/27/2016 1558    GLUCOSEU NEGATIVE 10/27/2016 1558   HGBUR NEGATIVE 10/27/2016 1558   BILIRUBINUR NEGATIVE 10/27/2016 1558   KETONESUR NEGATIVE 10/27/2016 1558   PROTEINUR NEGATIVE 10/27/2016 1558   NITRITE NEGATIVE 10/27/2016 1558   LEUKOCYTESUR NEGATIVE 10/27/2016 1558   Sepsis Labs: @LABRCNTIP (procalcitonin:4,lacticidven:4)  ) Recent Results (from the past 240 hour(s))  Culture, blood (routine x 2)     Status: None   Collection Time: 10/27/16  8:42 PM  Result Value Ref Range Status   Specimen Description BLOOD RIGHT HAND  Final   Special Requests AEROBIC BOTTLE ONLY  Final   Culture NO GROWTH 5 DAYS  Final   Report Status 11/02/2016 FINAL  Final  Culture, blood (routine x 2)     Status: None   Collection Time: 10/27/16  8:50 PM  Result Value Ref Range Status   Specimen Description BLOOD RIGHT HAND  Final   Special Requests BOTTLES DRAWN AEROBIC AND ANAEROBIC  Final   Culture NO GROWTH 5 DAYS  Final   Report Status 11/02/2016 FINAL  Final  MRSA PCR Screening     Status: None   Collection Time: 10/27/16  9:48 PM  Result Value Ref Range Status   MRSA by PCR NEGATIVE NEGATIVE Final    Comment:        The GeneXpert MRSA Assay (FDA approved for NASAL specimens only), is one component of a comprehensive MRSA colonization surveillance program. It is not intended to diagnose MRSA infection nor to guide or monitor treatment for MRSA infections.   C difficile quick scan w PCR reflex     Status: None   Collection Time: 11/02/16  5:10 PM  Result Value Ref Range Status   C Diff antigen NEGATIVE NEGATIVE Final   C Diff toxin NEGATIVE NEGATIVE Final   C Diff interpretation No C. difficile detected.  Final         Radiology Studies: Dg Chest Port 1 View  Result Date: 11/01/2016 CLINICAL DATA:  LEFT arm PICC line placement EXAM: PORTABLE CHEST 1 VIEW COMPARISON:  Portable exam 1547 hours compared to 0701 hours FINDINGS: BILATERAL upper extremity PICC lines with tips projecting over  SVC. Feeding tube extends to at least the inferior mediastinum. Normal heart size and mediastinal contours. Mild pulmonary vascular congestion. Persistent RIGHT lower lobe infiltrate. Remaining lungs clear. No pleural effusion or pneumothorax. Mild central peribronchial thickening again identified. Atherosclerotic calcification aorta. IMPRESSION: Tip of BILATERAL upper extremity PICC lines project over SVC above cavoatrial junction. Bronchitic changes with persistent RIGHT lower lobe infiltrate. Aortic atherosclerosis. Electronically Signed   By: Ulyses Southward M.D.   On: 11/01/2016 16:00   Dg Swallowing Func-speech Pathology  Result Date: 11/02/2016 Objective Swallowing Evaluation: Type of Study: MBS-Modified Barium Swallow Study Patient Details Name: JOSHUAL TERRIO MRN: [id number] Date of Birth: 07-May-1951 Today's Date: 11/02/2016 Time: SLP Start Time (ACUTE ONLY): 1330-SLP Stop Time (ACUTE ONLY): 1400 SLP Time Calculation (min) (ACUTE ONLY): 30 min Past Medical History: Past Medical History: Diagnosis Date . Anxiety  . Atrial fibrillation (HCC)  . CHF (congestive heart failure) (HCC)  . Coronary artery disease  . Diabetes mellitus without complication (HCC)  . GERD (gastroesophageal reflux disease)  . High cholesterol  . Hypertension  . Pneumonia  . Sepsis (HCC)  . Sleep apnea  Past Surgical History: No past surgical history on file. HPI: 65  yo man with COPD, dCHF, CKD, fecently at [facility] with resp `failure, volume overload, A Fib + RVR. Developed RLL HCAP. Was discharged 11/24 but was quickly readmitted with multisystem failure - encephalopathy, combined hypoxic and hypercapneic resp failure, acute on chronic renal failure. He was treated w BiPAP with some stabilization. Subjective: Pt sitting up in chair. Flat affect. Required cues to participate. Assessment / Plan / Recommendation CHL IP CLINICAL IMPRESSIONS 11/02/2016 Therapy Diagnosis Mild pharyngeal phase dysphagia;Mild oral phase dysphagia Clinical  Impression Pt currently with a mild oropharyngeal, sensorimotor dysphagia. Pt had premature spillage to the level of the vallecula and reduced laryngeal elevation resulting in silent penetration to the level of the vocal folds only x1 of thin liquids; cleared when cued pt to cough. Numerous additional trials of thin liquid attempted by cup and straw with no further penetration or aspiration. Pt had mild residuals at the base of tongue/ vallecula across consistencies which were cleared when cued to swallow a 2nd time. Pt continues to be at an increased risk of aspiration given these findings and current respiratory status. Recommend initiating dysphagia 2 diet, thin liquids, meds crushed in puree, full supervision during meals: cue small bites/ sips, cue pt to swallow 2 times, minimize distractions, only feed when alert. Will continue to follow for diet tolerance/ consider advancement. Impact on safety and function Moderate aspiration risk   CHL IP TREATMENT RECOMMENDATION 11/02/2016 Treatment Recommendations Therapy as outlined in treatment plan below   Prognosis 11/02/2016 Prognosis for Safe Diet Advancement Good Barriers to Reach Goals -- Barriers/Prognosis Comment -- CHL IP DIET RECOMMENDATION 11/02/2016 SLP Diet Recommendations Dysphagia 2 (Fine chop) solids;Thin liquid Liquid Administration via Cup;Straw Medication Administration Crushed with puree Compensations Slow rate;Small sips/bites;Minimize environmental distractions;Multiple dry swallows after each bite/sip Postural Changes Seated upright at 90 degrees;Remain semi-upright after after feeds/meals (Comment)   CHL IP OTHER RECOMMENDATIONS 11/02/2016 Recommended Consults -- Oral Care Recommendations Oral care BID Other Recommendations Clarify dietary restrictions   CHL IP FOLLOW UP RECOMMENDATIONS 11/02/2016 Follow up Recommendations 24 hour supervision/assistance   CHL IP FREQUENCY AND DURATION 11/02/2016 Speech Therapy Frequency (ACUTE ONLY) min 2x/week  Treatment Duration 2 weeks      CHL IP ORAL PHASE 11/02/2016 Oral Phase Impaired Oral - Pudding Teaspoon -- Oral - Pudding Cup -- Oral - Honey Teaspoon -- Oral - Honey Cup -- Oral - Nectar Teaspoon -- Oral - Nectar Cup -- Oral - Nectar Straw Premature spillage;Lingual/palatal residue Oral - Thin Teaspoon -- Oral - Thin Cup Lingual/palatal residue;Premature spillage Oral - Thin Straw Lingual/palatal residue;Premature spillage Oral - Puree WFL Oral - Mech Soft -- Oral - Regular Lingual/palatal residue;Premature spillage Oral - Multi-Consistency -- Oral - Pill -- Oral Phase - Comment --  CHL IP PHARYNGEAL PHASE 11/02/2016 Pharyngeal Phase Impaired Pharyngeal- Pudding Teaspoon -- Pharyngeal -- Pharyngeal- Pudding Cup -- Pharyngeal -- Pharyngeal- Honey Teaspoon -- Pharyngeal -- Pharyngeal- Honey Cup -- Pharyngeal -- Pharyngeal- Nectar Teaspoon -- Pharyngeal -- Pharyngeal- Nectar Cup -- Pharyngeal -- Pharyngeal- Nectar Straw Reduced laryngeal elevation;Delayed swallow initiation-vallecula;Pharyngeal residue - valleculae Pharyngeal -- Pharyngeal- Thin Teaspoon -- Pharyngeal -- Pharyngeal- Thin Cup Penetration/Aspiration during swallow;Reduced epiglottic inversion;Reduced laryngeal elevation;Reduced airway/laryngeal closure;Pharyngeal residue - valleculae Pharyngeal Material enters airway, CONTACTS cords and not ejected out Pharyngeal- Thin Straw Delayed swallow initiation-vallecula;Reduced laryngeal elevation;Pharyngeal residue - valleculae Pharyngeal -- Pharyngeal- Puree WFL Pharyngeal -- Pharyngeal- Mechanical Soft -- Pharyngeal -- Pharyngeal- Regular Pharyngeal residue - valleculae Pharyngeal -- Pharyngeal- Multi-consistency -- Pharyngeal -- Pharyngeal- Pill -- Pharyngeal -- Pharyngeal Comment --  CHL IP CERVICAL ESOPHAGEAL PHASE 11/02/2016 Cervical Esophageal Phase WFL Pudding Teaspoon -- Pudding Cup -- Honey Teaspoon -- Honey Cup -- Nectar Teaspoon -- Nectar Cup -- Nectar Straw -- Thin Teaspoon -- Thin Cup -- Thin Straw  -- Puree -- Mechanical Soft -- Regular -- Multi-consistency -- Pill -- Cervical Esophageal Comment -- No flowsheet data found. Metro Kung, MA, CCC-SLP 11/02/2016, 2:50 PM [phone]                  Scheduled Meds: . amiodarone  200 mg Oral Daily  . aspirin  81 mg Per Tube QPM  . atorvastatin  80 mg Oral QHS  . budesonide (PULMICORT) nebulizer solution  0.5 mg Nebulization BID  . chlorhexidine  15 mL Mouth Rinse BID  . Gerhardt's butt cream   Topical TID  . insulin aspart  0-20 Units Subcutaneous Q4H  . insulin glargine  12 Units Subcutaneous Daily  . ipratropium  0.5 mg Nebulization Q6H  . levalbuterol  0.63 mg Nebulization Q6H  . mouth rinse  15 mL Mouth Rinse q12n4p  . methylPREDNISolone (SOLU-MEDROL) injection  60 mg Intravenous Q24H  . sodium chloride flush  10-40 mL Intracatheter Q12H  . sodium chloride flush  10-40 mL Intracatheter Q12H  . white petrolatum   Topical Daily   Continuous Infusions: . feeding supplement (VITAL AF 1.2 CAL) 1,000 mL (11/03/16 0543)  . heparin 1,400 Units/hr (11/03/16 0543)     LOS: 7 days    Time spent: 40 minutes    Shaili Donalson, Roselind Messier, MD [facility] Pager [phone]   If 7PM-7AM, please contact night-coverage [email or web address] Password [facility] 11/03/2016, 8:18 AM

## 2016-11-03 NOTE — Evaluation (Signed)
Physical Therapy Evaluation Patient Details Name: Dylan Hubbard MRN: [id number] DOB: [birth year] Today's Date: 11/03/2016   History of Present Illness  pt presents with Respiratory Failure and Encephalopathy.  pt with hx of COPD, CHF, CKD, and A-fib.    Clinical Impression  Pt agreeable to attempt mobility with PT today, though only able to come to sitting for seconds before needing to return to supine due to stomach pain per pt.  Difficult to determine baseline cognition as pt is oriented, but makes off topic comments and seems to get distracted easily.  Unsure if this is baseline for pt.  Feel pt would benefit from SNF level of care and therapies at D/C.  Will continue to follow.      Follow Up Recommendations SNF    Equipment Recommendations  None recommended by PT    Recommendations for Other Services       Precautions / Restrictions Precautions Precautions: Fall Precaution Comments: NG tube and Flexiseal Restrictions Weight Bearing Restrictions: No      Mobility  Bed Mobility Overal bed mobility: Needs Assistance Bed Mobility: Supine to Sit;Sit to Supine     Supine to sit: Max assist Sit to supine: Max assist   General bed mobility comments: pt does attempt to A with coming to sitting, but requires A with Bil LEs and trunk.  Once in sitting pt indicates stomach pain and states begins to return to supine.    Transfers                    Ambulation/Gait                Stairs            Wheelchair Mobility    Modified Rankin (Stroke Patients Only)       Balance Overall balance assessment: Needs assistance Sitting-balance support: Bilateral upper extremity supported;Feet supported Sitting balance-Leahy Scale: Poor                                       Pertinent Vitals/Pain Pain Assessment: Faces Faces Pain Scale: Hurts little more Pain Location: pt indciates all over and then when asked again indicates his  stomach. Pain Intervention(s): Limited activity within patient's tolerance;Monitored during session;Premedicated before session;Repositioned    Home Living Family/patient expects to be discharged to:: Skilled nursing facility                      Prior Function           Comments: Unclear as pt focused on feeding tube and oxygen.     Hand Dominance        Extremity/Trunk Assessment   Upper Extremity Assessment: Generalized weakness           Lower Extremity Assessment: Generalized weakness         Communication   Communication:  (Soft spoken)  Cognition Arousal/Alertness: Awake/alert Behavior During Therapy: Flat affect Overall Cognitive Status: No family/caregiver present to determine baseline cognitive functioning                      General Comments      Exercises     Assessment/Plan    PT Assessment Patient needs continued PT services  PT Problem List Decreased strength;Decreased activity tolerance;Decreased balance;Decreased mobility;Decreased coordination;Decreased cognition;Decreased knowledge of use of DME;Decreased safety awareness;Pain  PT Treatment Interventions DME instruction;Gait training;Functional mobility training;Therapeutic activities;Therapeutic exercise;Balance training;Cognitive remediation;Patient/family education    PT Goals (Current goals can be found in the Care Plan section)  Acute Rehab PT Goals Patient Stated Goal: pt did not state. PT Goal Formulation: Patient unable to participate in goal setting Time For Goal Achievement: 11/17/16 Potential to Achieve Goals: Good    Frequency Min 2X/week   Barriers to discharge        Co-evaluation               End of Session Equipment Utilized During Treatment: Oxygen Activity Tolerance: Patient limited by fatigue;Patient limited by pain Patient left: in bed;with call bell/phone within reach (mitts replaced) Nurse Communication: Mobility  status;Need for lift equipment         Time: 1350-1414 PT Time Calculation (min) (ACUTE ONLY): 24 min   Charges:   PT Evaluation $PT Eval Moderate Complexity: 1 Procedure PT Treatments $Therapeutic Activity: 8-22 mins   PT G CodesSunny Schlein:        Ellarie Picking F Weldon [place], [place]  [phone] 11/03/2016, 3:27 PM

## 2016-11-04 DIAGNOSIS — J9622 Acute and chronic respiratory failure with hypercapnia: Secondary | ICD-10-CM

## 2016-11-04 DIAGNOSIS — E118 Type 2 diabetes mellitus with unspecified complications: Secondary | ICD-10-CM

## 2016-11-04 DIAGNOSIS — J9621 Acute and chronic respiratory failure with hypoxia: Secondary | ICD-10-CM

## 2016-11-04 DIAGNOSIS — N179 Acute kidney failure, unspecified: Secondary | ICD-10-CM

## 2016-11-04 DIAGNOSIS — J431 Panlobular emphysema: Secondary | ICD-10-CM

## 2016-11-04 DIAGNOSIS — G9341 Metabolic encephalopathy: Secondary | ICD-10-CM

## 2016-11-04 DIAGNOSIS — I248 Other forms of acute ischemic heart disease: Secondary | ICD-10-CM

## 2016-11-04 DIAGNOSIS — J441 Chronic obstructive pulmonary disease with (acute) exacerbation: Secondary | ICD-10-CM

## 2016-11-04 LAB — COMPREHENSIVE METABOLIC PANEL
ALBUMIN: 2.1 g/dL — AB (ref 3.5–5.0)
ALT: 23 U/L (ref 17–63)
ANION GAP: 7 (ref 5–15)
AST: 40 U/L (ref 15–41)
Alkaline Phosphatase: 108 U/L (ref 38–126)
BILIRUBIN TOTAL: 0.5 mg/dL (ref 0.3–1.2)
BUN: 79 mg/dL — ABNORMAL HIGH (ref 6–20)
CO2: 31 mmol/L (ref 22–32)
Calcium: 8.1 mg/dL — ABNORMAL LOW (ref 8.9–10.3)
Chloride: 103 mmol/L (ref 101–111)
Creatinine, Ser: 1.91 mg/dL — ABNORMAL HIGH (ref 0.61–1.24)
GFR calc Af Amer: 41 mL/min — ABNORMAL LOW (ref >60)
GFR, EST NON AFRICAN AMERICAN: 35 mL/min — AB (ref >60)
Glucose, Bld: 383 mg/dL — ABNORMAL HIGH (ref 65–99)
POTASSIUM: 5 mmol/L (ref 3.5–5.1)
Sodium: 141 mmol/L (ref 135–145)
TOTAL PROTEIN: 4.7 g/dL — AB (ref 6.5–8.1)

## 2016-11-04 LAB — GLUCOSE, CAPILLARY
GLUCOSE-CAPILLARY: 245 mg/dL — AB (ref 65–99)
GLUCOSE-CAPILLARY: 312 mg/dL — AB (ref 65–99)
GLUCOSE-CAPILLARY: 334 mg/dL — AB (ref 65–99)
Glucose-Capillary: 242 mg/dL — ABNORMAL HIGH (ref 65–99)
Glucose-Capillary: 60 mg/dL — ABNORMAL LOW (ref 65–99)
Glucose-Capillary: 104 mg/dL — ABNORMAL HIGH (ref 65–99)
Glucose-Capillary: 65 mg/dL (ref 65–99)
Glucose-Capillary: 65 mg/dL (ref 65–99)
Glucose-Capillary: 169 mg/dL — ABNORMAL HIGH (ref 65–99)

## 2016-11-04 LAB — CBC
HCT: 31.5 % — ABNORMAL LOW (ref 39.0–52.0)
Hemoglobin: 9.9 g/dL — ABNORMAL LOW (ref 13.0–17.0)
MCH: 29.8 pg (ref 26.0–34.0)
MCHC: 31.4 g/dL (ref 30.0–36.0)
MCV: 94.9 fL (ref 78.0–100.0)
PLATELETS: 367 10*3/uL (ref 150–400)
RBC: 3.32 MIL/uL — AB (ref 4.22–5.81)
RDW: 15 % (ref 11.5–15.5)
WBC: 18.6 10*3/uL — ABNORMAL HIGH (ref 4.0–10.5)

## 2016-11-04 LAB — PROTIME-INR
INR: 1.27
Prothrombin Time: 16 seconds — ABNORMAL HIGH (ref 11.4–15.2)

## 2016-11-04 LAB — MAGNESIUM: Magnesium: 1.9 mg/dL (ref 1.7–2.4)

## 2016-11-04 LAB — HEPARIN LEVEL (UNFRACTIONATED)
Heparin Unfractionated: 0.92 IU/mL — ABNORMAL HIGH (ref 0.30–0.70)
Heparin Unfractionated: 0.72 IU/mL — ABNORMAL HIGH (ref 0.30–0.70)

## 2016-11-04 MED ORDER — INSULIN GLARGINE 100 UNIT/ML ~~LOC~~ SOLN
4.0000 [IU] | Freq: Once | SUBCUTANEOUS | Status: AC
  Administered 2016-11-04: 4 [IU] via SUBCUTANEOUS
  Filled 2016-11-04: qty 0.04

## 2016-11-04 MED ORDER — LOPERAMIDE HCL 1 MG/5ML PO LIQD
2.0000 mg | Freq: Every day | ORAL | Status: DC
  Administered 2016-11-05 (×3): 2 mg via ORAL
  Filled 2016-11-04 (×6): qty 10

## 2016-11-04 MED ORDER — HEPARIN (PORCINE) IN NACL 100-0.45 UNIT/ML-% IJ SOLN
1200.0000 [IU]/h | INTRAMUSCULAR | Status: DC
  Administered 2016-11-05 – 2016-11-07 (×3): 1200 [IU]/h via INTRAVENOUS
  Filled 2016-11-04 (×3): qty 250

## 2016-11-04 MED ORDER — METHYLPREDNISOLONE SODIUM SUCC 40 MG IJ SOLR
15.0000 mg | INTRAMUSCULAR | Status: DC
  Administered 2016-11-04: 15.2 mg via INTRAVENOUS
  Filled 2016-11-04: qty 1

## 2016-11-04 MED ORDER — LEVALBUTEROL HCL 0.63 MG/3ML IN NEBU
0.6300 mg | INHALATION_SOLUTION | Freq: Three times a day (TID) | RESPIRATORY_TRACT | Status: DC
  Administered 2016-11-04 – 2016-11-12 (×25): 0.63 mg via RESPIRATORY_TRACT
  Filled 2016-11-04 (×25): qty 3

## 2016-11-04 MED ORDER — LOPERAMIDE HCL 2 MG PO CAPS: 4.0000 mg | ORAL_CAPSULE | Freq: Once | ORAL | Status: DC

## 2016-11-04 MED ORDER — INSULIN ASPART 100 UNIT/ML ~~LOC~~ SOLN
14.0000 [IU] | Freq: Three times a day (TID) | SUBCUTANEOUS | Status: DC
  Administered 2016-11-04 (×2): 14 [IU] via SUBCUTANEOUS

## 2016-11-04 MED ORDER — LOPERAMIDE HCL 1 MG/5ML PO LIQD
4.0000 mg | Freq: Once | ORAL | Status: AC
  Administered 2016-11-04: 4 mg via ORAL
  Filled 2016-11-04: qty 20

## 2016-11-04 MED ORDER — LOPERAMIDE HCL 2 MG PO CAPS: 2.0000 mg | ORAL_CAPSULE | Freq: Every day | ORAL | Status: DC

## 2016-11-04 MED ORDER — IPRATROPIUM BROMIDE 0.02 % IN SOLN
0.5000 mg | Freq: Three times a day (TID) | RESPIRATORY_TRACT | Status: DC
  Administered 2016-11-04 – 2016-11-12 (×25): 0.5 mg via RESPIRATORY_TRACT
  Filled 2016-11-04 (×25): qty 2.5

## 2016-11-04 NOTE — [place] FL2 (Signed)
[place] MEDICAID FL2 LEVEL OF CARE SCREENING TOOL     IDENTIFICATION  Patient Name: Dylan ClinesDouglas E Conrow Birthdate: May 08, 1951 Sex: male Admission Date (Current Location): 10/27/2016  [facility] [place]   Facility and Address:  [facility], [address], [place], [place] [address]      Provider Number: [id number]  Attending Physician Name and Address:  Drema Dallasurtis J Woods, MD  Relative Name and Phone Number:       Current Level of Care: Hospital Recommended Level of Care: Skilled Nursing Facility Prior Approval Number:    Date Approved/Denied:   PASRR Number:   [id number]   Discharge Plan: SNF    Current Diagnoses: Patient Active Problem List   Diagnosis Date Noted  . Encephalopathy, metabolic   . Panlobular emphysema (HCC)   . Acute on chronic respiratory failure with hypoxia (HCC)   . Demand ischemia (HCC)   . Acute renal failure (HCC)   . Controlled diabetes mellitus type 2 with complications (HCC)   . HCAP (healthcare-associated pneumonia)   . Diarrhea   . Acute respiratory failure with hypoxia (HCC)   . Chronic systolic CHF (congestive heart failure) (HCC)   . Cardiorenal syndrome   . Acute pulmonary edema (HCC)   . Atrial fibrillation with RVR (HCC)   . Respiratory distress   . Swelling of arm   . Acute encephalopathy   . Acute hypoxemic respiratory failure (HCC)   . AKI (acute kidney injury) (HCC)   . Respiratory failure (HCC) 10/27/2016  . Pressure injury of skin 10/27/2016    Orientation RESPIRATION BLADDER Height & Weight     Self, Time, Situation, Place  O2 (2L) Continent Weight:  (bed weight is broken ) Height:  5\' 7"  (170.2 cm)  BEHAVIORAL SYMPTOMS/MOOD NEUROLOGICAL BOWEL NUTRITION STATUS      Continent Diet (Diet DYS 2)  AMBULATORY STATUS COMMUNICATION OF NEEDS Skin   Extensive Assist Verbally Other (Comment) (3 Pressure wounds due to bipap machine on nose and head. )                        Personal Care Assistance Level of Assistance  Bathing, Feeding, Dressing Bathing Assistance: Limited assistance Feeding assistance: Limited assistance Dressing Assistance: Maximum assistance     Functional Limitations Info             SPECIAL CARE FACTORS FREQUENCY  PT (By licensed PT)     PT Frequency: 5 OT Frequency: 5            Contractures      Additional Factors Info  Code Status, Allergies Code Status Info: Partial  Allergies Info: PENICILLINS            Current Medications (11/04/2016):  This is the current hospital active medication list Current Facility-Administered Medications  Medication Dose Route Frequency Provider Last Rate Last Dose  . 0.9 %  sodium chloride infusion  250 mL Intravenous PRN Jamie KatoAaron Trimble, MD   Stopped at 10/31/16 0930  . amiodarone (PACERONE) tablet 200 mg  200 mg Oral Daily Jamie KatoAaron Trimble, MD   200 mg at 11/04/16 1030  . aspirin chewable tablet 81 mg  81 mg Per Tube QPM Nelda Bucksaniel J Feinstein, MD   81 mg at 11/03/16 1740  . atorvastatin (LIPITOR) tablet 80 mg  80 mg Oral QHS Jamie KatoAaron Trimble, MD   80 mg at 11/03/16 2244  . budesonide (PULMICORT) nebulizer solution 0.5 mg  0.5  mg Nebulization BID Louann SjogrenJose Angelo A de Dios, MD   0.5 mg at 11/04/16 [id number]  . chlorhexidine (PERIDEX) 0.12 % solution 15 mL  15 mL Mouth Rinse BID Zigmund GottronElizabeth C Deterding, MD   15 mL at 11/04/16 1030  . feeding supplement (VITAL AF 1.2 CAL) liquid 1,000 mL  1,000 mL Per Tube Continuous Nelda Bucksaniel J Feinstein, MD 70 mL/hr at 11/04/16 0136 1,000 mL at 11/04/16 0136  . Gerhardt's butt cream   Topical TID Jeanella CrazeBrandi L Ollis, NP      . heparin ADULT infusion 100 units/mL (25000 units/2950mL sodium chloride 0.45%)  1,300 Units/hr Intravenous Continuous Juliette MangleVeronda P Bryk, RPH 13 mL/hr at 11/04/16 0628 1,300 Units/hr at 11/04/16 [id number]  . insulin aspart (novoLOG) injection 0-20 Units  0-20 Units Subcutaneous Q4H Drema Dallasurtis J Woods, MD   15 Units at 11/04/16 [id number]  . insulin aspart (novoLOG)  injection 14 Units  14 Units Subcutaneous TID WC Drema Dallasurtis J Woods, MD   14 Units at 11/04/16 0914  . insulin glargine (LANTUS) injection 16 Units  16 Units Subcutaneous Daily Drema Dallasurtis J Woods, MD      . ipratropium (ATROVENT) nebulizer solution 0.5 mg  0.5 mg Nebulization Q6H Drema Dallasurtis J Woods, MD   0.5 mg at 11/04/16 0921  . levalbuterol (XOPENEX) nebulizer solution 0.63 mg  0.63 mg Nebulization Q6H Drema Dallasurtis J Woods, MD   0.63 mg at 11/04/16 [id number]  . MEDLINE mouth rinse  15 mL Mouth Rinse q12n4p Dorise HissElizabeth C Deterding, MD   15 mL at 11/03/16 1600  . methylPREDNISolone sodium succinate (SOLU-MEDROL) 40 mg/mL injection 30 mg  30 mg Intravenous Q24H Drema Dallasurtis J Woods, MD   30 mg at 11/03/16 2025  . ondansetron (ZOFRAN) injection 4 mg  4 mg Intravenous Q6H PRN Drema Dallasurtis J Woods, MD   4 mg at 11/03/16 1516  . sodium chloride flush (NS) 0.9 % injection 10-40 mL  10-40 mL Intracatheter Q12H Nelda Bucksaniel J Feinstein, MD   10 mL at 11/04/16 1031  . sodium chloride flush (NS) 0.9 % injection 10-40 mL  10-40 mL Intracatheter PRN Nelda Bucksaniel J Feinstein, MD      . sodium chloride flush (NS) 0.9 % injection 10-40 mL  10-40 mL Intracatheter Q12H Drema Dallasurtis J Woods, MD   20 mL at 11/04/16 1031  . sodium chloride flush (NS) 0.9 % injection 10-40 mL  10-40 mL Intracatheter PRN Drema Dallasurtis J Woods, MD      . white petrolatum (VASELINE) gel   Topical Daily Drema Dallasurtis J Woods, MD         Discharge Medications: Please see discharge summary for a list of discharge medications.  Relevant Imaging Results:  Relevant Lab Results:   Additional Information [id number]  Donnie CoffinErin M Sadat Sliwa, LCSW

## 2016-11-04 NOTE — Progress Notes (Signed)
ANTICOAGULATION CONSULT NOTE - Follow Up Consult  Pharmacy Consult for Heparin Indication: atrial fibrillation  Allergies  Allergen Reactions  . Penicillins Rash    No other information available at this time    Patient Measurements: Height: 5\' 7"  (170.2 cm) Weight:  (bed weight is broken ) IBW/kg (Calculated) : 66.1 Heparin Dosing Weight: 83.6 kg  Vital Signs: Temp: 98.4 F (36.9 C) (12/03 1156) Temp Source: Oral (12/03 1156) BP: 134/64 (12/03 1156) Pulse Rate: 91 (12/03 1156)  Labs:  Recent Labs  11/02/16 0515 11/02/16 2041 11/03/16 0121 11/03/16 0721 11/03/16 1400 11/04/16 0440 11/04/16 0920 11/04/16 1350  HGB 10.2*  --  9.9*  --   --  9.9*  --   --   HCT 32.5*  --  30.6*  --   --  31.5*  --   --   PLT 282  --  351  --   --  367  --   --   LABPROT 16.7*  --  16.6*  --   --  16.0*  --   --   INR 1.34  --  1.33  --   --  1.27  --   --   HEPARINUNFRC 0.46  --  0.28*  --  0.67 0.92*  --  0.72*  CREATININE 2.87*  2.73*  --   --  2.30*  --   --  1.91*  --   TROPONINI  --  0.11* 0.14* 0.13*  --   --   --   --     Estimated Creatinine Clearance: 40.8 mL/min (by C-G formula based on SCr of 1.91 mg/dL (H)).   Medications:  Scheduled:  . amiodarone  200 mg Oral Daily  . aspirin  81 mg Per Tube QPM  . atorvastatin  80 mg Oral QHS  . budesonide (PULMICORT) nebulizer solution  0.5 mg Nebulization BID  . chlorhexidine  15 mL Mouth Rinse BID  . Gerhardt's butt cream   Topical TID  . insulin aspart  0-20 Units Subcutaneous Q4H  . insulin aspart  14 Units Subcutaneous TID WC  . insulin glargine  16 Units Subcutaneous Daily  . ipratropium  0.5 mg Nebulization TID  . levalbuterol  0.63 mg Nebulization TID  . mouth rinse  15 mL Mouth Rinse q12n4p  . methylPREDNISolone (SOLU-MEDROL) injection  30 mg Intravenous Q24H  . sodium chloride flush  10-40 mL Intracatheter Q12H  . sodium chloride flush  10-40 mL Intracatheter Q12H  . white petrolatum   Topical Daily    Infusions:  . feeding supplement (VITAL AF 1.2 CAL) 1,000 mL (11/04/16 0136)  . heparin 1,300 Units/hr (11/04/16 [id number])    Assessment: 65 yo M with hx afib on Coumadin PTA.  Coumadin on hold since admission and pt started on heparin 11/30 when INR fell to <2.  Heparin remains slightly above goal on 1300 units/hr.  No bleeding noted.  Goal of Therapy:  Heparin level 0.3-0.7 units/ml Monitor platelets by anticoagulation protocol: Yes   Plan:  Reduce heparin to 1200 units/hr Restart Warfarin? Continue daily heparin level, CBC, and INR  Toys 'R' UsKimberly Felix Meras, Pharm.D., BCPS Clinical Pharmacist 11/04/2016 2:54 PM

## 2016-11-04 NOTE — Clinical Social Work Note (Signed)
Clinical Social Worker continuing to follow patient and family for support and discharge planning needs.  Patient was able to work with therapies briefly yesterday - unsure if patient is at baseline due to no family present, however recommendation remains for SNF placement.  CSW remains available for support and to facilitate patient discharge needs once medically stable.  Macario GoldsJesse Raymondo Garcialopez, [place] [phone]

## 2016-11-04 NOTE — Progress Notes (Signed)
ANTICOAGULATION CONSULT NOTE - Follow Up Consult  Pharmacy Consult for heparin Indication: atrial fibrillation  Labs:  Recent Labs  11/01/16 0530  11/02/16 0515 11/02/16 2041 11/03/16 0121 11/03/16 0721 11/03/16 1400 11/04/16 0440  HGB 10.8*  --  10.2*  --  9.9*  --   --  9.9*  HCT 33.5*  --  32.5*  --  30.6*  --   --  31.5*  PLT 296  --  282  --  351  --   --  367  LABPROT 18.3*  --  16.7*  --  16.6*  --   --  16.0*  INR 1.50  --  1.34  --  1.33  --   --  1.27  HEPARINUNFRC  --   < > 0.46  --  0.28*  --  0.67 0.92*  CREATININE 2.94*  --  2.87*  2.73*  --   --  2.30*  --   --   TROPONINI  --   --   --  0.11* 0.14* 0.13*  --   --   < > = values in this interval not displayed.  Assessment: 65yo male now above on heparin after one level at upper end goal; no signs of bleeding per RN.  Goal of Therapy:  Heparin level 0.3-0.7 units/ml   Plan:  Will decrease heparin gtt by 1-2 units/kg/hr to 1300 units/hr and check level in 8hr.  Vernard GamblesVeronda Jas Betten, PharmD, BCPS  11/04/2016,5:10 AM

## 2016-11-04 NOTE — Progress Notes (Signed)
PROGRESS NOTE    [name]  WUJ:[id number] DOB: 1951/10/17 DOA: 10/27/2016 PCP: Crist Fat, MD   Brief Narrative:  65 y/o WM PMHx Chronic Systolic CHF (EF 95-62%), Atrial Fibrillation with RVR on anticoagulation, HTN, HLD, Chronic Respiratory disease (chart carries dx of COPD) with Chronic Hypoxic and Hypercapnic respiratory failure, OSA. DM type II uncontrolled with complication  Presented to [facility] on 11/6 with progressively worsening dyspnea. He apparently had stopped his medications (which include lasix). He also had AF with RVR, with an attempt at cardioversion on 11/10 which was unsuccessful. He was rate (but not rhythm) controlled with amio and digoxin, both of which appeared to be new medications for him. He developed fever on 11/17, and CXR demonstrated RLL consolidation. He was initially treated with levofloxacin and vanc, but when his blood culture grew staph epi, it appears that he was narrowed to vanc only. He also had a steadily increasing creatinine from baseline <1 to ~3.0 on presentation to [facility].  He was discharged from [facility] on 11/24, and a PICC line was placed on the day of discharge. His wife spoke with him after the procedure, and said he was "out of head" yesterday afternoon because of the sedation he received for the procedure. On the morning of 11/25, he was found to be poorly responsive and EMS was called. Rather than re-presenting to [facility], EMS brought him to [facility]. He found to be in acute on chronic hypercarbic respiratory failure. He was started on BiPAP, and after a few hours, he did have some improvement in his mental status and blood gas.    Subjective: 12/3  A/O 3 (does not know why). Does know in the hospital. Negative CP, negative SOB. States feels as if there is something in his throat (globus)   Assessment & Plan:   Active Problems:   Respiratory failure (HCC)   Pressure injury of skin   Acute encephalopathy   Acute hypoxemic  respiratory failure (HCC)   AKI (acute kidney injury) (HCC)   Respiratory distress   Swelling of arm   Acute respiratory failure with hypoxia (HCC)   Chronic systolic CHF (congestive heart failure) (HCC)   Cardiorenal syndrome   Acute pulmonary edema (HCC)   Atrial fibrillation with RVR (HCC)   HCAP (healthcare-associated pneumonia)   Diarrhea   Encephalopathy, metabolic   Panlobular emphysema (HCC)   Acute on chronic respiratory failure with hypoxia (HCC)   Demand ischemia (HCC)   Acute renal failure (HCC)   Controlled diabetes mellitus type 2 with complications (HCC)  Metabolic Encephalopathy -Multifactorial to include COPD, HCAP, bacteremia, -Resolving  COPD exacerbation/HCAP -Completed 7 day course antibiotics -Pulmicort nebulizer BID -Atrovent QID -Xopenex QID -12/3 Decrease Solu-Medrol 15 mg daily  Acute on chronic hypercarbic / hypoxic respiratory failure -See COPD  Pulmonary edema -See CHF  Chronic Systolic CHF/Cardiorenal syndrome  -Amiodarone 200 mg daily -Digoxin on hold secondary to renal failure -Lasix 80 mg BID -Strict in and out since admission - 157 ml -Daily weight Filed Weights    Atrial Fibrillation with RVR -See CHF  Demand ischemia  Recent Labs Lab 10/28/16 1733 10/28/16 2311 10/29/16 0529 11/02/16 2041 11/03/16 0121 11/03/16 0721  TROPONINI 0.14* 0.15* 0.17* 0.11* 0.14* 0.13*  -Continue to trend -Most likely combination of hypoxic respiratory failure, A. fib with RVR, renal failure.   Acute renal failure? Unknown baseline -Improving Lab Results  Component Value Date   CREATININE 1.91 (H) 11/04/2016   CREATININE 2.30 (H) 11/03/2016   CREATININE  2.87 (H) 11/02/2016   CREATININE 2.73 (H) 11/02/2016   Hypokalemia -Potassium goal> 4  Hypomagnesemia -Magnesium goal> 2  Anemia  DM Type 2 Controlled With complication -12/1 Hemoglobin A1c= 6.7 -Lipid panel: Within ADA guidelines -12/2 increase Lantus 16 units -Increase  NovoLog 14 units  -Resistant SSI  Positive culture (Staph Epi) in blood - 1/2 cultures, likely contaminant  -At outside hospital, however blood culture here negative  Diarrhea -May be secondary to tube feeds. However patient has been on multiple antibiotics which were started at outside hospitals will check for infective cause -C. difficile/GI panel negative -Loperamide 4 mg now: Then 2 mg q 4hr    DVT prophylaxis: Heparin per pharmacy Code Status: Partial Family Communication: None Disposition Plan: SNF   Consultants:  [facility] M  Procedures/Significant Events:  11/27 echocardiogram:Left ventricle: mildly dilated. -LVEF =40% to 45%. Diffuse hypokinesis.  11/25  started on BiPAP, tx to [facility]  11/27  Remains on bipap  11/29  More alert / awake  VENTILATOR SETTINGS:    Cultures 11/25 blood 2 negative 11/25 MRSA by PCR negative    12/1 fecal lactoferrin pending 12/1 gastrointestinal panel negative 12/1 C. difficile panel negative   Antimicrobials: Vanc started at OSH 11/17 >>stopped?? Levofloxacin 11/17 >> ?? (~11/20?) Cefepime 11/25 >> 12/1   Devices    LINES / TUBES:  Rt PICC placed at OSH 11/24 >>    Continuous Infusions: . feeding supplement (VITAL AF 1.2 CAL) 1,000 mL (11/04/16 0136)  . heparin 1,300 Units/hr (11/04/16 1610)     Objective: Vitals:   11/03/16 2310 11/04/16 0348 11/04/16 0350 11/04/16 0700  BP: (!) 141/60 (!) 131/55 (!) 131/55   Pulse: 96 89 88   Resp: 17 11 12    Temp: 99 F (37.2 C) 99 F (37.2 C)  98.6 F (37 C)  TempSrc: Oral Oral  Oral  SpO2: 96% 94% 94%   Height:        Intake/Output Summary (Last 24 hours) at 11/04/16 0814 Last data filed at 11/04/16 0355  Gross per 24 hour  Intake           2136.6 ml  Output             1225 ml  Net            911.6 ml   Filed Weights    Examination:  General:A/O 3 (does not know why). Does know in the hospital., No acute respiratory distress Eyes: negative scleral hemorrhage,  negative anisocoria, negative icterus ENT: Negative Runny nose, negative gingival bleeding, Neck:  Negative scars, masses, torticollis, lymphadenopathy, JVD Lungs: Clear to auscultation bilaterally without wheezes or crackles Cardiovascular: Regular rate and rhythm without murmur gallop or rub normal S1 and S2 Abdomen: negative abdominal pain, nondistended, positive soft, bowel sounds, no rebound, no ascites, no appreciable mass Extremities: No significant cyanosis, clubbing, or edema bilateral lower extremities Skin: Negative rashes, lesions, ulcers Psychiatric:  Negative depression, negative anxiety, negative fatigue, negative mania  Central nervous system:  Cranial nerves II through XII intact, tongue/uvula midline, all extremities muscle strength 5/5, sensation intact throughout,negative dysarthria, negative expressive aphasia, negative receptive aphasia.  .     Data Reviewed: Care during the described time interval was provided by me .  I have reviewed this patient's available data, including medical history, events of note, physical examination, and all test results as part of my evaluation. I have personally reviewed and interpreted all radiology studies.  CBC:  Recent Labs Lab 10/31/16 0310 11/01/16  0530 11/02/16 0515 11/03/16 0121 11/04/16 0440  WBC 7.0 8.9 7.3 14.7* 18.6*  HGB 10.2* 10.8* 10.2* 9.9* 9.9*  HCT 32.0* 33.5* 32.5* 30.6* 31.5*  MCV 94.4 94.1 94.2 93.0 94.9  PLT 277 296 282 351 367   Basic Metabolic Panel:  Recent Labs Lab 10/28/16 1000  10/30/16 0411 10/30/16 0920 10/30/16 1648 10/31/16 0310 10/31/16 1700 11/01/16 0530 11/02/16 0515 11/03/16 0121 11/03/16 0721 11/04/16 0440  NA 130*  < > 140  --   --  142  --  143 142  140  --  141  --   K 3.7  < > 3.6  --   --  3.6  --  3.4* 5.3*  5.1  --  4.4  --   CL 95*  < > 104  --   --  107  --  103 102  100*  --  102  --   CO2 26  < > 28  --   --  28  --  31 28  30   --  30  --   GLUCOSE 121*  < >  102*  --   --  194*  --  138* 436*  436*  --  349*  --   BUN 24*  < > 33*  --   --  39*  --  45* 55*  54*  --  70*  --   CREATININE 3.81*  < > 4.19*  --   --  3.72*  --  2.94* 2.87*  2.73*  --  2.30*  --   CALCIUM 7.8*  < > 8.3*  --   --  8.1*  --  8.1* 7.9*  8.0*  --  8.1*  --   MG 2.0  --   --  1.9 1.9 1.8 1.7  --  1.4*  1.4* 2.1  --  1.9  PHOS 3.7  --   --  4.0 3.6 2.8 2.4*  --   --   --   --   --   < > = values in this interval not displayed. GFR: Estimated Creatinine Clearance: 33.9 mL/min (by C-G formula based on SCr of 2.3 mg/dL (H)). Liver Function Tests:  Recent Labs Lab 11/02/16 0515  AST 22  ALT 11*  ALKPHOS 103  BILITOT 0.4  PROT 4.7*  ALBUMIN 1.9*   No results for input(s): LIPASE, AMYLASE in the last 168 hours. No results for input(s): AMMONIA in the last 168 hours. Coagulation Profile:  Recent Labs Lab 10/31/16 0310 11/01/16 0530 11/02/16 0515 11/03/16 0121 11/04/16 0440  INR 2.11 1.50 1.34 1.33 1.27   Cardiac Enzymes:  Recent Labs Lab 10/28/16 2311 10/29/16 0529 11/02/16 2041 11/03/16 0121 11/03/16 0721  TROPONINI 0.15* 0.17* 0.11* 0.14* 0.13*   BNP (last 3 results) No results for input(s): PROBNP in the last 8760 hours. HbA1C:  Recent Labs  11/02/16 0515  HGBA1C 6.7*   CBG:  Recent Labs Lab 11/03/16 1525 11/03/16 2010 11/04/16 0006 11/04/16 0349 11/04/16 0804  GLUCAP 304* 204* 312* 245* 334*   Lipid Profile:  Recent Labs  11/02/16 0515  CHOL 75  75  HDL 18*  18*  LDLCALC 38  39  TRIG 96  91  CHOLHDL 4.2  4.2   Thyroid Function Tests: No results for input(s): TSH, T4TOTAL, FREET4, T3FREE, THYROIDAB in the last 72 hours. Anemia Panel: No results for input(s): VITAMINB12, FOLATE, FERRITIN, TIBC, IRON, RETICCTPCT in the last 72 hours. Urine analysis:  Component Value Date/Time   COLORURINE YELLOW 10/27/2016 1558   APPEARANCEUR CLOUDY (A) 10/27/2016 1558   LABSPEC 1.015 10/27/2016 1558   PHURINE 5.0  10/27/2016 1558   GLUCOSEU NEGATIVE 10/27/2016 1558   HGBUR NEGATIVE 10/27/2016 1558   BILIRUBINUR NEGATIVE 10/27/2016 1558   KETONESUR NEGATIVE 10/27/2016 1558   PROTEINUR NEGATIVE 10/27/2016 1558   NITRITE NEGATIVE 10/27/2016 1558   LEUKOCYTESUR NEGATIVE 10/27/2016 1558   Sepsis Labs: @LABRCNTIP (procalcitonin:4,lacticidven:4)  ) Recent Results (from the past 240 hour(s))  Culture, blood (routine x 2)     Status: None   Collection Time: 10/27/16  8:42 PM  Result Value Ref Range Status   Specimen Description BLOOD RIGHT HAND  Final   Special Requests AEROBIC BOTTLE ONLY 5ML  Final   Culture NO GROWTH 5 DAYS  Final   Report Status 11/02/2016 FINAL  Final  Culture, blood (routine x 2)     Status: None   Collection Time: 10/27/16  8:50 PM  Result Value Ref Range Status   Specimen Description BLOOD RIGHT HAND  Final   Special Requests BOTTLES DRAWN AEROBIC AND ANAEROBIC 5ML  Final   Culture NO GROWTH 5 DAYS  Final   Report Status 11/02/2016 FINAL  Final  MRSA PCR Screening     Status: None   Collection Time: 10/27/16  9:48 PM  Result Value Ref Range Status   MRSA by PCR NEGATIVE NEGATIVE Final    Comment:        The GeneXpert MRSA Assay (FDA approved for NASAL specimens only), is one component of a comprehensive MRSA colonization surveillance program. It is not intended to diagnose MRSA infection nor to guide or monitor treatment for MRSA infections.   Gastrointestinal Panel by PCR , Stool     Status: None   Collection Time: 11/02/16  5:10 PM  Result Value Ref Range Status   Campylobacter species NOT DETECTED NOT DETECTED Final   Plesimonas shigelloides NOT DETECTED NOT DETECTED Final   Salmonella species NOT DETECTED NOT DETECTED Final   Yersinia enterocolitica NOT DETECTED NOT DETECTED Final   Vibrio species NOT DETECTED NOT DETECTED Final   Vibrio cholerae NOT DETECTED NOT DETECTED Final   Enteroaggregative E coli (EAEC) NOT DETECTED NOT DETECTED Final    Enteropathogenic E coli (EPEC) NOT DETECTED NOT DETECTED Final   Enterotoxigenic E coli (ETEC) NOT DETECTED NOT DETECTED Final   Shiga like toxin producing E coli (STEC) NOT DETECTED NOT DETECTED Final   Shigella/Enteroinvasive E coli (EIEC) NOT DETECTED NOT DETECTED Final   Cryptosporidium NOT DETECTED NOT DETECTED Final   Cyclospora cayetanensis NOT DETECTED NOT DETECTED Final   Entamoeba histolytica NOT DETECTED NOT DETECTED Final   Giardia lamblia NOT DETECTED NOT DETECTED Final   Adenovirus F40/41 NOT DETECTED NOT DETECTED Final   Astrovirus NOT DETECTED NOT DETECTED Final   Norovirus GI/GII NOT DETECTED NOT DETECTED Final   Rotavirus A NOT DETECTED NOT DETECTED Final   Sapovirus (I, II, IV, and V) NOT DETECTED NOT DETECTED Final  C difficile quick scan w PCR reflex     Status: None   Collection Time: 11/02/16  5:10 PM  Result Value Ref Range Status   C Diff antigen NEGATIVE NEGATIVE Final   C Diff toxin NEGATIVE NEGATIVE Final   C Diff interpretation No C. difficile detected.  Final         Radiology Studies: Dg Swallowing Func-speech Pathology  Result Date: 11/02/2016 Objective Swallowing Evaluation: Type of Study: MBS-Modified Barium Swallow Study Patient Details  Name: Dylan Hubbard MRN: [id number] Date of Birth: July 07, 1951 Today's Date: 11/02/2016 Time: SLP Start Time (ACUTE ONLY): 1330-SLP Stop Time (ACUTE ONLY): 1400 SLP Time Calculation (min) (ACUTE ONLY): 30 min Past Medical History: Past Medical History: Diagnosis Date . Anxiety  . Atrial fibrillation (HCC)  . CHF (congestive heart failure) (HCC)  . Coronary artery disease  . Diabetes mellitus without complication (HCC)  . GERD (gastroesophageal reflux disease)  . High cholesterol  . Hypertension  . Pneumonia  . Sepsis (HCC)  . Sleep apnea  Past Surgical History: No past surgical history on file. HPI: 65 yo man with COPD, dCHF, CKD, [facility] `[facility], volume overload, A Fib + RVR. Developed RLL HCAP.  Was discharged 11/24 but was quickly readmitted with multisystem failure - encephalopathy, combined hypoxic and hypercapneic resp failure, acute on chronic renal failure. He was treated w BiPAP with some stabilization. Subjective: Pt sitting up in chair. Flat affect. Required cues to participate. Assessment / Plan / Recommendation CHL IP CLINICAL IMPRESSIONS 11/02/2016 Therapy Diagnosis Mild pharyngeal phase dysphagia;Mild oral phase dysphagia Clinical Impression Pt currently with a mild oropharyngeal, sensorimotor dysphagia. Pt had premature spillage to the level of the vallecula and reduced laryngeal elevation resulting in silent penetration to the level of the vocal folds only x1 of thin liquids; cleared when cued pt to cough. Numerous additional trials of thin liquid attempted by cup and straw with no further penetration or aspiration. Pt had mild residuals at the base of tongue/ vallecula across consistencies which were cleared when cued to swallow a 2nd time. Pt continues to be at an increased risk of aspiration given these findings and current respiratory status. Recommend initiating dysphagia 2 diet, thin liquids, meds crushed in puree, full supervision during meals: cue small bites/ sips, cue pt to swallow 2 times, minimize distractions, only feed when alert. Will continue to follow for diet tolerance/ consider advancement. Impact on safety and function Moderate aspiration risk   CHL IP TREATMENT RECOMMENDATION 11/02/2016 Treatment Recommendations Therapy as outlined in treatment plan below   Prognosis 11/02/2016 Prognosis for Safe Diet Advancement Good Barriers to Reach Goals -- Barriers/Prognosis Comment -- CHL IP DIET RECOMMENDATION 11/02/2016 SLP Diet Recommendations Dysphagia 2 (Fine chop) solids;Thin liquid Liquid Administration via Cup;Straw Medication Administration Crushed with puree Compensations Slow rate;Small sips/bites;Minimize environmental distractions;Multiple dry swallows after each bite/sip  Postural Changes Seated upright at 90 degrees;Remain semi-upright after after feeds/meals (Comment)   CHL IP OTHER RECOMMENDATIONS 11/02/2016 Recommended Consults -- Oral Care Recommendations Oral care BID Other Recommendations Clarify dietary restrictions   CHL IP FOLLOW UP RECOMMENDATIONS 11/02/2016 Follow up Recommendations 24 hour supervision/assistance   CHL IP FREQUENCY AND DURATION 11/02/2016 Speech Therapy Frequency (ACUTE ONLY) min 2x/week Treatment Duration 2 weeks      CHL IP ORAL PHASE 11/02/2016 Oral Phase Impaired Oral - Pudding Teaspoon -- Oral - Pudding Cup -- Oral - Honey Teaspoon -- Oral - Honey Cup -- Oral - Nectar Teaspoon -- Oral - Nectar Cup -- Oral - Nectar Straw Premature spillage;Lingual/palatal residue Oral - Thin Teaspoon -- Oral - Thin Cup Lingual/palatal residue;Premature spillage Oral - Thin Straw Lingual/palatal residue;Premature spillage Oral - Puree WFL Oral - Mech Soft -- Oral - Regular Lingual/palatal residue;Premature spillage Oral - Multi-Consistency -- Oral - Pill -- Oral Phase - Comment --  CHL IP PHARYNGEAL PHASE 11/02/2016 Pharyngeal Phase Impaired Pharyngeal- Pudding Teaspoon -- Pharyngeal -- Pharyngeal- Pudding Cup -- Pharyngeal -- Pharyngeal- Honey Teaspoon -- Pharyngeal -- Pharyngeal- Honey Cup --  Pharyngeal -- Pharyngeal- Nectar Teaspoon -- Pharyngeal -- Pharyngeal- Nectar Cup -- Pharyngeal -- Pharyngeal- Nectar Straw Reduced laryngeal elevation;Delayed swallow initiation-vallecula;Pharyngeal residue - valleculae Pharyngeal -- Pharyngeal- Thin Teaspoon -- Pharyngeal -- Pharyngeal- Thin Cup Penetration/Aspiration during swallow;Reduced epiglottic inversion;Reduced laryngeal elevation;Reduced airway/laryngeal closure;Pharyngeal residue - valleculae Pharyngeal Material enters airway, CONTACTS cords and not ejected out Pharyngeal- Thin Straw Delayed swallow initiation-vallecula;Reduced laryngeal elevation;Pharyngeal residue - valleculae Pharyngeal -- Pharyngeal- Puree WFL  Pharyngeal -- Pharyngeal- Mechanical Soft -- Pharyngeal -- Pharyngeal- Regular Pharyngeal residue - valleculae Pharyngeal -- Pharyngeal- Multi-consistency -- Pharyngeal -- Pharyngeal- Pill -- Pharyngeal -- Pharyngeal Comment --  CHL IP CERVICAL ESOPHAGEAL PHASE 11/02/2016 Cervical Esophageal Phase WFL Pudding Teaspoon -- Pudding Cup -- Honey Teaspoon -- Honey Cup -- Nectar Teaspoon -- Nectar Cup -- Nectar Straw -- Thin Teaspoon -- Thin Cup -- Thin Straw -- Puree -- Mechanical Soft -- Regular -- Multi-consistency -- Pill -- Cervical Esophageal Comment -- No flowsheet data found. Metro Kung, MA, CCC-SLP 11/02/2016, 2:50 PM [phone]                  Scheduled Meds: . amiodarone  200 mg Oral Daily  . aspirin  81 mg Per Tube QPM  . atorvastatin  80 mg Oral QHS  . budesonide (PULMICORT) nebulizer solution  0.5 mg Nebulization BID  . chlorhexidine  15 mL Mouth Rinse BID  . Gerhardt's butt cream   Topical TID  . insulin aspart  0-20 Units Subcutaneous Q4H  . insulin aspart  8 Units Subcutaneous TID WC  . insulin glargine  16 Units Subcutaneous Daily  . ipratropium  0.5 mg Nebulization Q6H  . levalbuterol  0.63 mg Nebulization Q6H  . mouth rinse  15 mL Mouth Rinse q12n4p  . methylPREDNISolone (SOLU-MEDROL) injection  30 mg Intravenous Q24H  . sodium chloride flush  10-40 mL Intracatheter Q12H  . sodium chloride flush  10-40 mL Intracatheter Q12H  . white petrolatum   Topical Daily   Continuous Infusions: . feeding supplement (VITAL AF 1.2 CAL) 1,000 mL (11/04/16 0136)  . heparin 1,300 Units/hr (11/04/16 0628)     LOS: 8 days    Time spent: 40 minutes    WOODS, Roselind Messier, MD [facility] Pager [phone]   If 7PM-7AM, please contact night-coverage [email or web address] Password [email or web address] 11/04/2016, 8:14 AM

## 2016-11-04 NOTE — Progress Notes (Signed)
Patients blood sugar 60 administered 4oz of apple juice rechecked patients blood sugar increased to 65 administered 4oz of apple juice rechecked patients blood sugar increased to 104.

## 2016-11-05 DIAGNOSIS — N170 Acute kidney failure with tubular necrosis: Secondary | ICD-10-CM

## 2016-11-05 LAB — GLUCOSE, CAPILLARY
GLUCOSE-CAPILLARY: 185 mg/dL — AB (ref 65–99)
GLUCOSE-CAPILLARY: 200 mg/dL — AB (ref 65–99)
GLUCOSE-CAPILLARY: 75 mg/dL (ref 65–99)
Glucose-Capillary: 184 mg/dL — ABNORMAL HIGH (ref 65–99)
Glucose-Capillary: 139 mg/dL — ABNORMAL HIGH (ref 65–99)

## 2016-11-05 LAB — CBC
HEMATOCRIT: 33.6 % — AB (ref 39.0–52.0)
HEMOGLOBIN: 10.4 g/dL — AB (ref 13.0–17.0)
MCH: 29.5 pg (ref 26.0–34.0)
MCHC: 31 g/dL (ref 30.0–36.0)
MCV: 95.5 fL (ref 78.0–100.0)
Platelets: 387 10*3/uL (ref 150–400)
RBC: 3.52 MIL/uL — ABNORMAL LOW (ref 4.22–5.81)
RDW: 15 % (ref 11.5–15.5)
WBC: 18.9 10*3/uL — ABNORMAL HIGH (ref 4.0–10.5)

## 2016-11-05 LAB — PROTIME-INR
INR: 1.32
Prothrombin Time: 16.5 seconds — ABNORMAL HIGH (ref 11.4–15.2)

## 2016-11-05 LAB — HEPARIN LEVEL (UNFRACTIONATED): HEPARIN UNFRACTIONATED: 0.63 [IU]/mL (ref 0.30–0.70)

## 2016-11-05 LAB — MAGNESIUM: Magnesium: 1.6 mg/dL — ABNORMAL LOW (ref 1.7–2.4)

## 2016-11-05 MED ORDER — ENSURE ENLIVE PO LIQD
237.0000 mL | Freq: Three times a day (TID) | ORAL | Status: DC
  Administered 2016-11-05 – 2016-11-11 (×13): 237 mL via ORAL

## 2016-11-05 MED ORDER — WARFARIN SODIUM 5 MG PO TABS
10.0000 mg | ORAL_TABLET | Freq: Once | ORAL | Status: AC
  Administered 2016-11-05: 10 mg via ORAL
  Filled 2016-11-05: qty 2

## 2016-11-05 MED ORDER — ALPRAZOLAM 0.5 MG PO TABS
0.5000 mg | ORAL_TABLET | Freq: Three times a day (TID) | ORAL | Status: DC | PRN
  Administered 2016-11-05 – 2016-11-09 (×8): 0.5 mg via ORAL
  Filled 2016-11-05 (×9): qty 1

## 2016-11-05 MED ORDER — INSULIN ASPART 100 UNIT/ML ~~LOC~~ SOLN
8.0000 [IU] | Freq: Three times a day (TID) | SUBCUTANEOUS | Status: DC
  Administered 2016-11-05: 8 [IU] via SUBCUTANEOUS

## 2016-11-05 MED ORDER — ALPRAZOLAM 0.5 MG PO TABS
0.5000 mg | ORAL_TABLET | Freq: Once | ORAL | Status: AC
  Administered 2016-11-05: 0.5 mg via ORAL
  Filled 2016-11-05: qty 1

## 2016-11-05 MED ORDER — LOPERAMIDE HCL 1 MG/5ML PO LIQD
2.0000 mg | ORAL | Status: DC | PRN
  Filled 2016-11-05: qty 10

## 2016-11-05 MED ORDER — METOPROLOL TARTRATE 12.5 MG HALF TABLET
12.5000 mg | ORAL_TABLET | Freq: Two times a day (BID) | ORAL | Status: DC
  Administered 2016-11-05 – 2016-11-08 (×7): 12.5 mg via ORAL
  Filled 2016-11-05 (×8): qty 1

## 2016-11-05 MED ORDER — WARFARIN - PHARMACIST DOSING INPATIENT
Freq: Every day | Status: DC
  Administered 2016-11-07 – 2016-11-12 (×6)

## 2016-11-05 MED ORDER — INSULIN ASPART 100 UNIT/ML ~~LOC~~ SOLN: 0.0000 [IU] | Freq: Every day | SUBCUTANEOUS | Status: DC

## 2016-11-05 MED ORDER — INSULIN ASPART 100 UNIT/ML ~~LOC~~ SOLN
0.0000 [IU] | Freq: Three times a day (TID) | SUBCUTANEOUS | Status: DC
  Administered 2016-11-05 – 2016-11-08 (×6): 4 [IU] via SUBCUTANEOUS
  Administered 2016-11-08 – 2016-11-10 (×2): 3 [IU] via SUBCUTANEOUS

## 2016-11-05 NOTE — Care Management Note (Signed)
Case Management Note  Patient Details  Name: Dylan ClinesDouglas E Halvorsen MRN: [id number] Date of Birth: 01-12-1951  Subjective/Objective:      Presents with metabolic encephalopathy, acute/chronic resp failure, chronic chf, afib with rvr, demand ischemia and acute renal failure, heart rate conts to be poorly controlled, and trops elevated. conts on hep drio, [facility], CSW following.              Action/Plan:   Expected Discharge Date:                  Expected Discharge Plan:  Skilled Nursing Facility  In-House Referral:  Clinical Social Work  Discharge planning Services  CM Consult  Post Acute Care Choice:    Choice offered to:     DME Arranged:    DME Agency:     HH Arranged:    HH Agency:     Status of Service:  Completed, signed off  If discussed at [employer] Meetings, dates discussed:    Additional Comments:  Leone Havenaylor, Jonessa Triplett Clinton, RN 11/05/2016, 7:58 PM

## 2016-11-05 NOTE — Progress Notes (Signed)
Nutrition Follow-up  DOCUMENTATION CODES:   Not applicable  INTERVENTION:    Ensure Enlive po TID, each supplement provides 350 kcal and 20 grams of protein  NUTRITION DIAGNOSIS:   Inadequate oral intake related to dysphagia, poor appetite as evidenced by meal completion < 50%.  Ongoing  GOAL:   Patient will meet greater than or equal to 90% of their needs  Unmet  MONITOR:   PO intake, Supplement acceptance, Labs, Skin  ASSESSMENT:   65 y/o man with a history of CHF (EF 25-30%), AF on AC, as well as chronic respiratory disease (chart carries dx of COPD) with chronic hypoxic and hypercapnic respiratory failure  Discussed patient with RN today. Diet has been advanced to dysphagia 2 with thin liquids. He is consuming 10-25% of meals. Would benefit from a PO supplement to ensure adequate calorie and protein intake. Labs reviewed: magnesium remains low. Medications reviewed and include Solumedrol.  Diet Order:  DIET DYS 2 Room service appropriate? Yes; Fluid consistency: Thin  Skin:  Wound (see comment) (Stage II to sacrum)  Last BM:  N/A  Height:   Ht Readings from Last 1 Encounters:  10/31/16 5\' 7"  (1.702 m)    Weight:   Wt Readings from Last 1 Encounters:  No data found for Wt    Ideal Body Weight:  67.2 kg  BMI:  Body mass index is 29.44 kg/m.  Estimated Nutritional Needs:   Kcal:  1900-2100  Protein:  120-130 gm  Fluid:  per MD  EDUCATION NEEDS:   No education needs identified at this time  Joaquin CourtsKimberly Neythan Kozlov, RD, LDN, CNSC Pager [phone] After Hours Pager [phone]

## 2016-11-05 NOTE — Progress Notes (Signed)
[place] TEAM 1 - Stepdown/ICU TEAM  Dylan ClinesDouglas E Hubbard  NWG:[id number] DOB: 1951-01-27 DOA: 10/27/2016 PCP: Crist FatVAN EYK, JASON, MD    Brief Narrative:  65 y/o M Hx Chronic Systolic CHF (EF 57-84%25-30%), Atrial Fibrillation with RVR on anticoagulation, HTN, HLD, COPD, OSA, and DM II who presented to [facility] on 11/6 with progressively worsening dyspnea. He had stopped his medications. He was noted to be in AF with RVR.  An attempt at cardioversion on 11/10 was unsuccessful. His rate was able to be controlled with amio and digoxin. He developed fever on 11/17, and CXR demonstrated RLL consolidation. He was treated with levofloxacin and vanc, but when his blood culture grew staph epi he was narrowed to vanc only. He also had a steadily increasing creatinine from baseline <1 to ~3.0 on presentation to [facility].  He was discharged from [facility] on 11/24, and a PICC line was placed on the day of discharge. On the morning of 11/25, he was found to be poorly responsive and EMS was called. Rather than re[facility], [facility]. He was found to be in acute on chronic hypercarbic respiratory failure. He was started on BiPAP, and after a few hours, he did have some improvement in his mental status and blood gas.  Subjective: The patient is having difficulty with significant anxiety today.  He denies chest pain fevers chills nausea or vomiting.  During his anxiety he reports a sensation that he cannot breathe as well.  Assessment & Plan:  Metabolic Encephalopathy -Multifactorial to include COPD, HCAP, bacteremia, hypercarbia - appears to be improving   COPD exacerbation due to HCAP - Acute on chronic hypercarbic / hypoxic respiratory failure -Completed 7 day course antibiotics -Pulmicort nebulizer BID -Atrovent QID -Xopenex QID -discontinue steroid and follow   Chronic Systolic CHF - Pulmonary edema  No weights recorded for many days - ordered daily weights again - I/O even -  resume home lasix dose and follow   Atrial Fibrillation with RVR Heart rate is poorly controlled today, likely related to anxiety - resume BB and follow   Demand ischemia -trop appears to have peaked at 0.14  Acute renal failure / Cardiorenal syndrome Improving renal fxn - follow   Recent Labs Lab 10/31/16 0310 11/01/16 0530 11/02/16 0515 11/03/16 0721 11/04/16 0920  CREATININE 3.72* 2.94* 2.87*  2.73* 2.30* 1.91*    Normocytic anemia -check anemia panel   DM Type 2  -12/1 A1c 6.7 - CBG above goal - stop steroid and follow   Positive blood culture (Staph epi) - OSH -At outside hospital 1 of 2 blood cx reportedly +, however blood culture here negative - likely contaminant   Diarrhea -C. difficile/GI panel negative -Loperamide prn  DVT prophylaxis: heparin Code Status: NO CPR but YES to all other code elements  Family Communication: no family present at time of exam  Disposition Plan: SDU  Consultants:  PCCM  Procedures: 11/27 TTE Left ventricle: mildly dilated. -LVEF =40% to 45%. Diffuse hypokinesis.  Antimicrobials:  Cefepime 11/25 > 12/1  Objective: Blood pressure 124/67, pulse (!) 130, temperature 97.5 F (36.4 C), temperature source Oral, resp. rate 16, height 5\' 7"  (1.702 m), weight 85.3 kg (188 lb), SpO2 98 %.  Intake/Output Summary (Last 24 hours) at 11/05/16 1441 Last data filed at 11/05/16 0554  Gross per 24 hour  Intake           291.93 ml  Output  250 ml  Net            41.93 ml   Filed Weights    Examination: General: No acute respiratory distress Lungs: Clear to auscultation bilaterally without wheezes or crackles Cardiovascular: irreg irreg - HR 130bpm Abdomen: Nontender, nondistended, soft, bowel sounds positive, no rebound, no ascites, no appreciable mass Extremities: No significant cyanosis, clubbing, or edema bilateral lower extremities  CBC:  Recent Labs Lab 11/01/16 0530 11/02/16 0515 11/03/16 0121  11/04/16 0440 11/05/16 0400  WBC 8.9 7.3 14.7* 18.6* 18.9*  HGB 10.8* 10.2* 9.9* 9.9* 10.4*  HCT 33.5* 32.5* 30.6* 31.5* 33.6*  MCV 94.1 94.2 93.0 94.9 95.5  PLT 296 282 351 367 387   Basic Metabolic Panel:  Recent Labs Lab 10/30/16 0920 10/30/16 1648 10/31/16 0310 10/31/16 1700 11/01/16 0530 11/02/16 0515 11/03/16 0121 11/03/16 0721 11/04/16 0440 11/04/16 0920 11/05/16 0400  NA  --   --  142  --  143 142  140  --  141  --  141  --   K  --   --  3.6  --  3.4* 5.3*  5.1  --  4.4  --  5.0  --   CL  --   --  107  --  103 102  100*  --  102  --  103  --   CO2  --   --  28  --  31 28  30   --  30  --  31  --   GLUCOSE  --   --  194*  --  138* 436*  436*  --  349*  --  383*  --   BUN  --   --  39*  --  45* 55*  54*  --  70*  --  79*  --   CREATININE  --   --  3.72*  --  2.94* 2.87*  2.73*  --  2.30*  --  1.91*  --   CALCIUM  --   --  8.1*  --  8.1* 7.9*  8.0*  --  8.1*  --  8.1*  --   MG 1.9 1.9 1.8 1.7  --  1.4*  1.4* 2.1  --  1.9  --  1.6*  PHOS 4.0 3.6 2.8 2.4*  --   --   --   --   --   --   --    GFR: Estimated Creatinine Clearance: 40.8 mL/min (by C-G formula based on SCr of 1.91 mg/dL (H)).  Liver Function Tests:  Recent Labs Lab 11/02/16 0515 11/04/16 0920  AST 22 40  ALT 11* 23  ALKPHOS 103 108  BILITOT 0.4 0.5  PROT 4.7* 4.7*  ALBUMIN 1.9* 2.1*   Coagulation Profile:  Recent Labs Lab 11/01/16 0530 11/02/16 0515 11/03/16 0121 11/04/16 0440 11/05/16 0400  INR 1.50 1.34 1.33 1.27 1.32    Cardiac Enzymes:  Recent Labs Lab 11/02/16 2041 11/03/16 0121 11/03/16 0721  TROPONINI 0.11* 0.14* 0.13*    HbA1C: Hgb A1c MFr Bld  Date/Time Value Ref Range Status  11/02/2016 05:15 AM 6.7 (H) 4.8 - 5.6 % Final    Comment:    (NOTE)         Pre-diabetes: 5.7 - 6.4         Diabetes: >6.4         Glycemic control for adults with diabetes: <7.0     CBG:  Recent Labs Lab 11/04/16 1718 11/04/16 1945 11/05/16 0554  11/05/16 0806  11/05/16 1210  GLUCAP 104* 169* 184* 185* 200*    Recent Results (from the past 240 hour(s))  Culture, blood (routine x 2)     Status: None   Collection Time: 10/27/16  8:42 PM  Result Value Ref Range Status   Specimen Description BLOOD RIGHT HAND  Final   Special Requests AEROBIC BOTTLE ONLY  Final   Culture NO GROWTH 5 DAYS  Final   Report Status 11/02/2016 FINAL  Final  Culture, blood (routine x 2)     Status: None   Collection Time: 10/27/16  8:50 PM  Result Value Ref Range Status   Specimen Description BLOOD RIGHT HAND  Final   Special Requests BOTTLES DRAWN AEROBIC AND ANAEROBIC  Final   Culture NO GROWTH 5 DAYS  Final   Report Status 11/02/2016 FINAL  Final  MRSA PCR Screening     Status: None   Collection Time: 10/27/16  9:48 PM  Result Value Ref Range Status   MRSA by PCR NEGATIVE NEGATIVE Final    Comment:        The GeneXpert MRSA Assay (FDA approved for NASAL specimens only), is one component of a comprehensive MRSA colonization surveillance program. It is not intended to diagnose MRSA infection nor to guide or monitor treatment for MRSA infections.   Gastrointestinal Panel by PCR , Stool     Status: None   Collection Time: 11/02/16  5:10 PM  Result Value Ref Range Status   Campylobacter species NOT DETECTED NOT DETECTED Final   Plesimonas shigelloides NOT DETECTED NOT DETECTED Final   Salmonella species NOT DETECTED NOT DETECTED Final   Yersinia enterocolitica NOT DETECTED NOT DETECTED Final   Vibrio species NOT DETECTED NOT DETECTED Final   Vibrio cholerae NOT DETECTED NOT DETECTED Final   Enteroaggregative E coli (EAEC) NOT DETECTED NOT DETECTED Final   Enteropathogenic E coli (EPEC) NOT DETECTED NOT DETECTED Final   Enterotoxigenic E coli (ETEC) NOT DETECTED NOT DETECTED Final   Shiga like toxin producing E coli (STEC) NOT DETECTED NOT DETECTED Final   Shigella/Enteroinvasive E coli (EIEC) NOT DETECTED NOT DETECTED Final   Cryptosporidium NOT  DETECTED NOT DETECTED Final   Cyclospora cayetanensis NOT DETECTED NOT DETECTED Final   Entamoeba histolytica NOT DETECTED NOT DETECTED Final   Giardia lamblia NOT DETECTED NOT DETECTED Final   Adenovirus F40/41 NOT DETECTED NOT DETECTED Final   Astrovirus NOT DETECTED NOT DETECTED Final   Norovirus GI/GII NOT DETECTED NOT DETECTED Final   Rotavirus A NOT DETECTED NOT DETECTED Final   Sapovirus (I, II, IV, and V) NOT DETECTED NOT DETECTED Final  C difficile quick scan w PCR reflex     Status: None   Collection Time: 11/02/16  5:10 PM  Result Value Ref Range Status   C Diff antigen NEGATIVE NEGATIVE Final   C Diff toxin NEGATIVE NEGATIVE Final   C Diff interpretation No C. difficile detected.  Final     Scheduled Meds: . amiodarone  200 mg Oral Daily  . aspirin  81 mg Per Tube QPM  . atorvastatin  80 mg Oral QHS  . budesonide (PULMICORT) nebulizer solution  0.5 mg Nebulization BID  . chlorhexidine  15 mL Mouth Rinse BID  . feeding supplement (ENSURE ENLIVE)  237 mL Oral TID WC  . Gerhardt's butt cream   Topical TID  . insulin aspart  0-20 Units Subcutaneous TID WC  . insulin aspart  0-5 Units Subcutaneous QHS  . insulin  aspart  8 Units Subcutaneous TID WC  . insulin glargine  16 Units Subcutaneous Daily  . ipratropium  0.5 mg Nebulization TID  . levalbuterol  0.63 mg Nebulization TID  . loperamide  2 mg Oral 6 X Daily  . mouth rinse  15 mL Mouth Rinse q12n4p  . methylPREDNISolone (SOLU-MEDROL) injection  15.2 mg Intravenous Q24H  . sodium chloride flush  10-40 mL Intracatheter Q12H  . sodium chloride flush  10-40 mL Intracatheter Q12H  . white petrolatum   Topical Daily   Continuous Infusions: . heparin 1,200 Units/hr (11/04/16 1815)     LOS: 9 days   Lonia Blood, MD [facility] Office  [phone] Pager - Text Page per Loretha Stapler as per below:  On-Call/Text Page:      [email or web address]      password [email or web address]  If 7PM-7AM, please contact  night-coverage [email or web address] Password [email or web address] 11/05/2016, 2:41 PM

## 2016-11-05 NOTE — Care Management Important Message (Signed)
Important Message  Patient Details  Name: Van ClinesDouglas E Mages MRN: [id number] Date of Birth: [birth year]   Medicare Important Message Given:  Yes    Kyla BalzarineShealy, Natilie Krabbenhoft Abena 11/05/2016, 11:23 AM

## 2016-11-05 NOTE — Progress Notes (Signed)
Speech Language Pathology Treatment: Dysphagia  Patient Details Name: Dylan ClinesDouglas E Friske MRN: [id number] DOB: 1951-11-29 Today's Date: 11/05/2016 Time: [id number] SLP Time Calculation (min) (ACUTE ONLY): 22 min  Assessment / Plan / Recommendation Clinical Impression  Pt is nervous to try more solid foods, and declines anything more solid than purees. He says he feels like food from breakfast is still sitting in his esophagus. Pt did consume additional thin liquids and purees under SLP supervision with no overt s/s of aspiration. Mod cues provided for sustained attention and use of compensatory strategies throughout intake. Recommend to continue current diet with full supervision and cueing for both aspiration and esophageal precautions. May need to consider softening foods further if pt continues to decline even chopped solids.    HPI HPI: 65 yo man with COPD, dCHF, CKD, [facility] with resp `failure, volume overload, A Fib + RVR. Developed RLL HCAP. Was discharged 11/24 but was quickly readmitted with multisystem failure - encephalopathy, combined hypoxic and hypercapneic resp failure, acute on chronic renal failure. He was treated w BiPAP with some stabilization.      SLP Plan  Continue with current plan of care     Recommendations  Diet recommendations: Dysphagia 2 (fine chop);Thin liquid Liquids provided via: Cup;Straw Medication Administration: Crushed with puree Supervision: Patient able to self feed;Full supervision/cueing for compensatory strategies Compensations: Slow rate;Small sips/bites;Minimize environmental distractions;Multiple dry swallows after each bite/sip Postural Changes and/or Swallow Maneuvers: Seated upright 90 degrees;Upright 30-60 min after meal                Oral Care Recommendations: Oral care BID Follow up Recommendations: Skilled Nursing facility;24 hour supervision/assistance Plan: Continue with current plan of care       GO                 [name], Voncile Schwarz 11/05/2016, 2:29 PM  [name], M.A. CCC-SLP [phone]

## 2016-11-05 NOTE — Progress Notes (Signed)
Patient resting comfortably on 3L [place] with O2 sat 98%. BIPAP is not needed at this time. RT will continue to monitor as needed.

## 2016-11-05 NOTE — Progress Notes (Addendum)
ANTICOAGULATION CONSULT NOTE - Follow Up Consult  Pharmacy Consult for Heparin+Coumadin Indication: atrial fibrillation  Allergies  Allergen Reactions  . Penicillins Rash    No other information available at this time    Patient Measurements: Height: 5\' 7"  (170.2 cm) Weight:  (no value) IBW/kg (Calculated) : 66.1 Heparin Dosing Weight: 83.6 kg  Vital Signs: Temp: 97.5 F (36.4 C) (12/04 0808) Temp Source: Oral (12/04 0808) BP: 142/60 (12/04 0808) Pulse Rate: 90 (12/04 0808)  Labs:  Recent Labs  11/02/16 2041  11/03/16 0121 11/03/16 0721  11/04/16 0440 11/04/16 0920 11/04/16 1350 11/05/16 0400  HGB  --   < > 9.9*  --   --  9.9*  --   --  10.4*  HCT  --   --  30.6*  --   --  31.5*  --   --  33.6*  PLT  --   --  351  --   --  367  --   --  387  LABPROT  --   --  16.6*  --   --  16.0*  --   --  16.5*  INR  --   --  1.33  --   --  1.27  --   --  1.32  HEPARINUNFRC  --   --  0.28*  --   < > 0.92*  --  0.72* 0.63  CREATININE  --   --   --  2.30*  --   --  1.91*  --   --   TROPONINI 0.11*  --  0.14* 0.13*  --   --   --   --   --   < > = values in this interval not displayed.  Estimated Creatinine Clearance: 40.8 mL/min (by C-G formula based on SCr of 1.91 mg/dL (H)).   Medications:  Scheduled:  . amiodarone  200 mg Oral Daily  . aspirin  81 mg Per Tube QPM  . atorvastatin  80 mg Oral QHS  . budesonide (PULMICORT) nebulizer solution  0.5 mg Nebulization BID  . chlorhexidine  15 mL Mouth Rinse BID  . Gerhardt's butt cream   Topical TID  . insulin aspart  0-20 Units Subcutaneous TID WC  . insulin aspart  0-5 Units Subcutaneous QHS  . insulin aspart  8 Units Subcutaneous TID WC  . insulin glargine  16 Units Subcutaneous Daily  . ipratropium  0.5 mg Nebulization TID  . levalbuterol  0.63 mg Nebulization TID  . loperamide  2 mg Oral 6 X Daily  . mouth rinse  15 mL Mouth Rinse q12n4p  . methylPREDNISolone (SOLU-MEDROL) injection  15.2 mg Intravenous Q24H  . sodium  chloride flush  10-40 mL Intracatheter Q12H  . sodium chloride flush  10-40 mL Intracatheter Q12H  . white petrolatum   Topical Daily   Infusions:  . heparin 1,200 Units/hr (11/04/16 1815)    Assessment: 65 yo M with hx afib on Coumadin PTA.  Coumadin on hold since admission and pt started on heparin 11/30 when INR fell to <2.    Anticoag: Coumadin for afib (on PTA). HL 0.63 in goal. Hgb 10.4 up. Plts ok. INR 1.32. Resume Coumadin today -[facility] 7.5[facility] /day but not clear what he was given [facility]  Goal of Therapy:  Heparin level 0.3-0.7 units/ml Monitor platelets by anticoagulation protocol: Yes   Plan:  Continue heparin at 1200 units/hr Coumadin 10mg  po x 1 tonight. Daily heparin level and CBC  Shiquan Mathieu S. Merilynn Finlandobertson, PharmD, BCPS Clinical Staff Pharmacist Pager [phone] t 11/05/2016 11:31 AM

## 2016-11-06 LAB — FERRITIN: Ferritin: 204 ng/mL (ref 24–336)

## 2016-11-06 LAB — COMPREHENSIVE METABOLIC PANEL
ALBUMIN: 2.1 g/dL — AB (ref 3.5–5.0)
ALK PHOS: 102 U/L (ref 38–126)
ALT: 88 U/L — ABNORMAL HIGH (ref 17–63)
ANION GAP: 6 (ref 5–15)
AST: 124 U/L — AB (ref 15–41)
BILIRUBIN TOTAL: 0.5 mg/dL (ref 0.3–1.2)
BUN: 49 mg/dL — AB (ref 6–20)
CALCIUM: 8.5 mg/dL — AB (ref 8.9–10.3)
CO2: 31 mmol/L (ref 22–32)
Chloride: 103 mmol/L (ref 101–111)
Creatinine, Ser: 1.37 mg/dL — ABNORMAL HIGH (ref 0.61–1.24)
GFR calc Af Amer: >60 mL/min (ref >60)
GFR calc non Af Amer: 53 mL/min — ABNORMAL LOW (ref >60)
GLUCOSE: 95 mg/dL (ref 65–99)
Potassium: 4 mmol/L (ref 3.5–5.1)
Sodium: 140 mmol/L (ref 135–145)
TOTAL PROTEIN: 4.2 g/dL — AB (ref 6.5–8.1)

## 2016-11-06 LAB — GLUCOSE, CAPILLARY
GLUCOSE-CAPILLARY: 167 mg/dL — AB (ref 65–99)
Glucose-Capillary: 101 mg/dL — ABNORMAL HIGH (ref 65–99)
Glucose-Capillary: 156 mg/dL — ABNORMAL HIGH (ref 65–99)
Glucose-Capillary: 93 mg/dL (ref 65–99)
Glucose-Capillary: 104 mg/dL — ABNORMAL HIGH (ref 65–99)

## 2016-11-06 LAB — VITAMIN B12: VITAMIN B 12: 938 pg/mL — AB (ref 180–914)

## 2016-11-06 LAB — CBC
HEMATOCRIT: 31.7 % — AB (ref 39.0–52.0)
HEMOGLOBIN: 9.9 g/dL — AB (ref 13.0–17.0)
MCH: 30 pg (ref 26.0–34.0)
MCHC: 31.2 g/dL (ref 30.0–36.0)
MCV: 96.1 fL (ref 78.0–100.0)
Platelets: 321 10*3/uL (ref 150–400)
RBC: 3.3 MIL/uL — ABNORMAL LOW (ref 4.22–5.81)
RDW: 15.1 % (ref 11.5–15.5)
WBC: 12.4 10*3/uL — ABNORMAL HIGH (ref 4.0–10.5)

## 2016-11-06 LAB — MAGNESIUM: Magnesium: 1.5 mg/dL — ABNORMAL LOW (ref 1.7–2.4)

## 2016-11-06 LAB — RETICULOCYTES
RBC.: 3.3 MIL/uL — ABNORMAL LOW (ref 4.22–5.81)
RETIC CT PCT: 2.1 % (ref 0.4–3.1)
Retic Count, Absolute: 69.3 10*3/uL (ref 19.0–186.0)

## 2016-11-06 LAB — FOLATE: Folate: 5.6 ng/mL — ABNORMAL LOW (ref >5.9)

## 2016-11-06 LAB — IRON AND TIBC
Iron: 74 ug/dL (ref 45–182)
SATURATION RATIOS: 35 % (ref 17.9–39.5)
TIBC: 210 ug/dL — AB (ref 250–450)
UIBC: 136 ug/dL

## 2016-11-06 LAB — HEPARIN LEVEL (UNFRACTIONATED): HEPARIN UNFRACTIONATED: 0.54 [IU]/mL (ref 0.30–0.70)

## 2016-11-06 LAB — PROTIME-INR
INR: 1.38
Prothrombin Time: 17.1 seconds — ABNORMAL HIGH (ref 11.4–15.2)

## 2016-11-06 MED ORDER — MAGNESIUM SULFATE 50 % IJ SOLN
3.0000 g | Freq: Once | INTRAVENOUS | Status: AC
  Administered 2016-11-06: 3 g via INTRAVENOUS
  Filled 2016-11-06: qty 6

## 2016-11-06 MED ORDER — WARFARIN SODIUM 5 MG PO TABS
10.0000 mg | ORAL_TABLET | Freq: Once | ORAL | Status: AC
  Administered 2016-11-06: 10 mg via ORAL
  Filled 2016-11-06: qty 2

## 2016-11-06 MED ORDER — LOPERAMIDE HCL 1 MG/5ML PO LIQD
2.0000 mg | Freq: Four times a day (QID) | ORAL | Status: DC
  Administered 2016-11-06 – 2016-11-11 (×15): 2 mg via ORAL
  Filled 2016-11-06 (×31): qty 10

## 2016-11-06 NOTE — Progress Notes (Signed)
Patients HR sustaining 120-130s, BP 108/65 patient is complaining of anxiety administered PRN Xanax.  Dr. Joseph ArtWoods updated on patients condition.  Will continue to monitor.

## 2016-11-06 NOTE — Progress Notes (Signed)
Speech Language Pathology Treatment: Dysphagia  Patient Details Name: Dylan ClinesDouglas E Staib MRN: [id number] DOB: 05/15/1951 Today's Date: 11/06/2016 Time: [id number] SLP Time Calculation (min) (ACUTE ONLY): 8 min  Assessment / Plan / Recommendation Clinical Impression  Pt consumed thin liquids by straw with no overt s/s of aspiration given Min cues for slower pacing. He politely declined any solid intake, even soft pureed foods. Will continue to follow for tolerance and determination for least restrictive diet to allow for the most food options but with the safest consistencies.   HPI HPI: 65 yo man with COPD, dCHF, CKD, [facility] resp `failure, volume overload, A Fib + RVR. Developed RLL HCAP. Was discharged 11/24 but was quickly readmitted with multisystem failure - encephalopathy, combined hypoxic and hypercapneic resp failure, acute on chronic renal failure. He was treated w BiPAP with some stabilization.      SLP Plan  Continue with current plan of care     Recommendations  Diet recommendations: Dysphagia 2 (fine chop);Thin liquid Liquids provided via: Cup;Straw Medication Administration: Crushed with puree Supervision: Patient able to self feed;Full supervision/cueing for compensatory strategies Compensations: Slow rate;Small sips/bites;Minimize environmental distractions;Multiple dry swallows after each bite/sip Postural Changes and/or Swallow Maneuvers: Seated upright 90 degrees;Upright 30-60 min after meal                Oral Care Recommendations: Oral care BID Follow up Recommendations: Skilled Nursing facility;24 hour supervision/assistance Plan: Continue with current plan of care       GO                [name], Sammie Schermerhorn 11/06/2016, 4:12 PM  [name], M.A. CCC-SLP [phone]

## 2016-11-06 NOTE — Progress Notes (Signed)
ANTICOAGULATION CONSULT NOTE - Follow Up Consult  Pharmacy Consult for Heparin+Coumadin Indication: atrial fibrillation  Allergies  Allergen Reactions  . Penicillins Rash    No other information available at this time    Patient Measurements: Height: 5\' 7"  (170.2 cm) Weight:  (no value) IBW/kg (Calculated) : 66.1 Heparin Dosing Weight: 83.6 kg  Vital Signs: Temp: 97.5 F (36.4 C) (12/05 0743) Temp Source: Oral (12/05 0743) BP: 99/59 (12/05 0743) Pulse Rate: 47 (12/05 0743)  Labs:  Recent Labs  11/04/16 0440 11/04/16 0920 11/04/16 1350 11/05/16 0400 11/06/16 0545  HGB 9.9*  --   --  10.4* 9.9*  HCT 31.5*  --   --  33.6* 31.7*  PLT 367  --   --  387 321  LABPROT 16.0*  --   --  16.5* 17.1*  INR 1.27  --   --  1.32 1.38  HEPARINUNFRC 0.92*  --  0.72* 0.63 0.54  CREATININE  --  1.91*  --   --  1.37*    Estimated Creatinine Clearance: 56.9 mL/min (by C-G formula based on SCr of 1.37 mg/dL (H)).   Medications:  Scheduled:  . amiodarone  200 mg Oral Daily  . aspirin  81 mg Per Tube QPM  . atorvastatin  80 mg Oral QHS  . budesonide (PULMICORT) nebulizer solution  0.5 mg Nebulization BID  . chlorhexidine  15 mL Mouth Rinse BID  . feeding supplement (ENSURE ENLIVE)  237 mL Oral TID WC  . Gerhardt's butt cream   Topical TID  . insulin aspart  0-20 Units Subcutaneous TID WC  . insulin aspart  0-5 Units Subcutaneous QHS  . insulin aspart  8 Units Subcutaneous TID WC  . insulin glargine  16 Units Subcutaneous Daily  . ipratropium  0.5 mg Nebulization TID  . levalbuterol  0.63 mg Nebulization TID  . magnesium sulfate 1 - 4 g bolus IVPB  3 g Intravenous Once  . mouth rinse  15 mL Mouth Rinse q12n4p  . metoprolol tartrate  12.5 mg Oral BID  . warfarin  10 mg Oral ONCE-1800  . Warfarin - Pharmacist Dosing Inpatient   Does not apply q1800  . white petrolatum   Topical Daily   Infusions:  . heparin 1,200 Units/hr (11/06/16 0745)    Assessment: 65 yo M with hx afib  on Coumadin PTA.  Coumadin on hold since admission and pt started on heparin 11/30 when INR fell to <2.    Anticoag: Coumadin for afib (on PTA). HL 0.54 in goal. Hgb 9.9. Plts 321 down. INR 1.38. -[facility] 7.5[facility] /day but not clear [facility]  Goal of Therapy:  Heparin level 0.3-0.7 units/ml Monitor platelets by anticoagulation protocol: Yes   Plan:  Continue heparin at 1200 units/hr Coumadin 10mg  po x 1 tonight. Daily heparin level and CBC, INR  Balen Woolum S. Merilynn Finlandobertson, PharmD, BCPS Clinical Staff Pharmacist Pager [phone] t 11/06/2016 10:28 AM

## 2016-11-06 NOTE — Progress Notes (Addendum)
PROGRESS NOTE    Dylan Hubbard  UJW:[id number] DOB: 03-Dec-1951 DOA: 10/27/2016 PCP: Crist Fat, MD   Brief Narrative:  65 y/o WM PMHx Chronic Systolic CHF (EF 56-21%), Atrial Fibrillation with RVR on anticoagulation, HTN, HLD, Chronic Respiratory disease (chart carries dx of COPD) with Chronic Hypoxic and Hypercapnic respiratory failure, OSA. DM type II uncontrolled with complication  Presented to [facility] on 11/6 with progressively worsening dyspnea. He apparently had stopped his medications (which include lasix). He also had AF with RVR, with an attempt at cardioversion on 11/10 which was unsuccessful. He was rate (but not rhythm) controlled with amio and digoxin, both of which appeared to be new medications for him. He developed fever on 11/17, and CXR demonstrated RLL consolidation. He was initially treated with levofloxacin and vanc, but when his blood culture grew staph epi, it appears that he was narrowed to vanc only. He also had a steadily increasing creatinine from baseline <1 to ~3.0 on presentation to [facility]  He was discharged from [place] on 11/24, and a PICC line was placed on the day of discharge. His wife spoke with him after the procedure, and said he was "out of head" yesterday afternoon because of the sedation he received for the procedure. On the morning of 11/25, he was found to be poorly responsive and EMS was called. Rather than re-presenting to [facility], EMS brought him to [facility]. He found to be in acute on chronic hypercarbic respiratory failure. He was started on BiPAP, and after a few hours, he did have some improvement in his mental status and blood gas.    Subjective: 12/5  A/O 4 Negative CP, negative SOB. States some pain with sitting in chair today but stated tuffed it out.     Assessment & Plan:   Active Problems:   Respiratory failure (HCC)   Pressure injury of skin   Acute encephalopathy   Acute hypoxemic respiratory failure (HCC)   AKI  (acute kidney injury) (HCC)   Respiratory distress   Swelling of arm   Acute respiratory failure with hypoxia (HCC)   Chronic systolic CHF (congestive heart failure) (HCC)   Cardiorenal syndrome   Acute pulmonary edema (HCC)   Atrial fibrillation with RVR (HCC)   HCAP (healthcare-associated pneumonia)   Diarrhea   Encephalopathy, metabolic   Panlobular emphysema (HCC)   Acute on chronic respiratory failure with hypoxia (HCC)   Demand ischemia (HCC)   Acute renal failure (HCC)   Controlled diabetes mellitus type 2 with complications (HCC)   COPD exacerbation (HCC)   Acute on chronic respiratory failure with hypoxia and hypercapnia (HCC)  Metabolic Encephalopathy -Multifactorial to include COPD, HCAP, bacteremia, -Resolving  COPD exacerbation/HCAP -Completed 7 day course antibiotics -Pulmicort nebulizer BID -Atrovent TID -Xopenex TID  Acute on chronic hypercarbic / hypoxic respiratory failure -See COPD  Pulmonary edema -See CHF  Chronic Systolic CHF/Cardiorenal syndrome  -Amiodarone 200 mg daily -Metoprolol 12.5 mg BID -Digoxin on hold secondary to renal failure -Strict in and out since admission - 388 ml -Daily weight Filed Weights    Atrial Fibrillation with RVR -See CHF -Coumadin per pharmacy Recent Labs Lab 11/02/16 0515 11/03/16 0121 11/04/16 0440 11/05/16 0400 11/06/16 0545  INR 1.34 1.33 1.27 1.32 1.38    Demand ischemia  Recent Labs Lab 11/02/16 2041 11/03/16 0121 11/03/16 0721  TROPONINI 0.11* 0.14* 0.13*  -Continue to trend -Most likely combination of hypoxic respiratory failure, A. fib with RVR, renal failure.   Acute renal failure? Unknown baseline -  Improving Lab Results  Component Value Date   CREATININE 1.37 (H) 11/06/2016   CREATININE 1.91 (H) 11/04/2016   CREATININE 2.30 (H) 11/03/2016   Hypokalemia -Potassium goal> 4  Hypomagnesemia -Magnesium goal> 2 -Magnesium IV 3 gm  Anemia  DM Type 2 Controlled With  complication -12/1 Hemoglobin A1c= 6.7 -Lipid panel: Within ADA guidelines -Resistant SSI  Positive culture (Staph Epi) in blood - 1/2 cultures, likely contaminant  -At outside hospital, however blood culture here negative  Diarrhea -C. difficile/GI panel negative -Loperamide 2 mg QID     DVT prophylaxis: Heparin per pharmacy--> Coumadin Code Status: Partial Family Communication: None Disposition Plan: SNF   Consultants:  [facility]  Procedures/Significant Events:  11/27 echocardiogram:Left ventricle: mildly dilated. -LVEF =40% to 45%. Diffuse hypokinesis.  11/25  started on BiPAP, tx to [facility]  11/27  Remains on bipap  11/29  More alert / awake  VENTILATOR SETTINGS:    Cultures 11/25 blood 2 negative 11/25 MRSA by PCR negative    12/1 fecal lactoferrin pending 12/1 gastrointestinal panel negative 12/1 C. difficile panel negative   Antimicrobials: Vanc started at OSH 11/17 >>stopped?? Levofloxacin 11/17 >> ?? (~11/20?) Cefepime 11/25 >> 12/1   Devices    LINES / TUBES:  Rt PICC placed at OSH 11/24 >>    Continuous Infusions: . heparin 1,200 Units/hr (11/05/16 1816)     Objective: Vitals:   11/06/16 0100 11/06/16 0403 11/06/16 0405 11/06/16 0743  BP:  120/65 120/65 (!) 99/59  Pulse:  60 95 (!) 47  Resp:  13 14 15   Temp: 98.6 F (37 C) 98 F (36.7 C)  97.5 F (36.4 C)  TempSrc: Oral Oral  Oral  SpO2:  100% 100% 100%  Height:        Intake/Output Summary (Last 24 hours) at 11/06/16 8413 Last data filed at 11/05/16 2236  Gross per 24 hour  Intake              240 ml  Output              475 ml  Net             -235 ml   Filed Weights    Examination:  General:A/O 4, No acute respiratory distress Eyes: negative scleral hemorrhage, negative anisocoria, negative icterus ENT: Negative Runny nose, negative gingival bleeding, Neck:  Negative scars, masses, torticollis, lymphadenopathy, JVD Lungs: Clear to auscultation bilaterally without  wheezes or crackles Cardiovascular: Regular rate and rhythm without murmur gallop or rub normal S1 and S2 Abdomen: negative abdominal pain, nondistended, positive soft, bowel sounds, no rebound, no ascites, no appreciable mass Extremities: No significant cyanosis, clubbing, or edema bilateral lower extremities Skin: Negative rashes, lesions, ulcers Psychiatric:  Negative depression, negative anxiety, negative fatigue, negative mania  Central nervous system:  Cranial nerves II through XII intact, tongue/uvula midline, all extremities muscle strength 5/5, sensation intact throughout,negative dysarthria, negative expressive aphasia, negative receptive aphasia.  .     Data Reviewed: Care during the described time interval was provided by me .  I have reviewed this patient's available data, including medical history, events of note, physical examination, and all test results as part of my evaluation. I have personally reviewed and interpreted all radiology studies.  CBC:  Recent Labs Lab 11/02/16 0515 11/03/16 0121 11/04/16 0440 11/05/16 0400 11/06/16 0545  WBC 7.3 14.7* 18.6* 18.9* 12.4*  HGB 10.2* 9.9* 9.9* 10.4* 9.9*  HCT 32.5* 30.6* 31.5* 33.6* 31.7*  MCV 94.2 93.0 94.9 95.5  96.1  PLT 282 351 367 387 321   Basic Metabolic Panel:  Recent Labs Lab 10/30/16 0920 10/30/16 1648  10/31/16 0310 10/31/16 1700 11/01/16 0530 11/02/16 0515 11/03/16 0121 11/03/16 0721 11/04/16 0440 11/04/16 0920 11/05/16 0400 11/06/16 0545  NA  --   --   < > 142  --  143 142  140  --  141  --  141  --  140  K  --   --   < > 3.6  --  3.4* 5.3*  5.1  --  4.4  --  5.0  --  4.0  CL  --   --   < > 107  --  103 102  100*  --  102  --  103  --  103  CO2  --   --   < > 28  --  31 28  30   --  30  --  31  --  31  GLUCOSE  --   --   < > 194*  --  138* 436*  436*  --  349*  --  383*  --  95  BUN  --   --   < > 39*  --  45* 55*  54*  --  70*  --  79*  --  49*  CREATININE  --   --   < > 3.72*  --  2.94*  2.87*  2.73*  --  2.30*  --  1.91*  --  1.37*  CALCIUM  --   --   < > 8.1*  --  8.1* 7.9*  8.0*  --  8.1*  --  8.1*  --  8.5*  MG 1.9 1.9  --  1.8 1.7  --  1.4*  1.4* 2.1  --  1.9  --  1.6* 1.5*  PHOS 4.0 3.6  --  2.8 2.4*  --   --   --   --   --   --   --   --   < > = values in this interval not displayed. GFR: Estimated Creatinine Clearance: 56.9 mL/min (by C-G formula based on SCr of 1.37 mg/dL (H)). Liver Function Tests:  Recent Labs Lab 11/02/16 0515 11/04/16 0920 11/06/16 0545  AST 22 40 124*  ALT 11* 23 88*  ALKPHOS 103 108 102  BILITOT 0.4 0.5 0.5  PROT 4.7* 4.7* 4.2*  ALBUMIN 1.9* 2.1* 2.1*   No results for input(s): LIPASE, AMYLASE in the last 168 hours. No results for input(s): AMMONIA in the last 168 hours. Coagulation Profile:  Recent Labs Lab 11/02/16 0515 11/03/16 0121 11/04/16 0440 11/05/16 0400 11/06/16 0545  INR 1.34 1.33 1.27 1.32 1.38   Cardiac Enzymes:  Recent Labs Lab 11/02/16 2041 11/03/16 0121 11/03/16 0721  TROPONINI 0.11* 0.14* 0.13*   BNP (last 3 results) No results for input(s): PROBNP in the last 8760 hours. HbA1C: No results for input(s): HGBA1C in the last 72 hours. CBG:  Recent Labs Lab 11/05/16 0806 11/05/16 1210 11/05/16 1613 11/05/16 2111 11/06/16 0745  GLUCAP 185* 200* 75 139* 101*   Lipid Profile: No results for input(s): CHOL, HDL, LDLCALC, TRIG, CHOLHDL, LDLDIRECT in the last 72 hours. Thyroid Function Tests: No results for input(s): TSH, T4TOTAL, FREET4, T3FREE, THYROIDAB in the last 72 hours. Anemia Panel:  Recent Labs  11/06/16 0545  VITAMINB12 938*  FOLATE 5.6*  FERRITIN 204  TIBC 210*  IRON 74  RETICCTPCT 2.1   Urine analysis:  Component Value Date/Time   COLORURINE YELLOW 10/27/2016 1558   APPEARANCEUR CLOUDY (A) 10/27/2016 1558   LABSPEC 1.015 10/27/2016 1558   PHURINE 5.0 10/27/2016 1558   GLUCOSEU NEGATIVE 10/27/2016 1558   HGBUR NEGATIVE 10/27/2016 1558   BILIRUBINUR NEGATIVE  10/27/2016 1558   KETONESUR NEGATIVE 10/27/2016 1558   PROTEINUR NEGATIVE 10/27/2016 1558   NITRITE NEGATIVE 10/27/2016 1558   LEUKOCYTESUR NEGATIVE 10/27/2016 1558   Sepsis Labs: @LABRCNTIP (procalcitonin:4,lacticidven:4)  ) Recent Results (from the past 240 hour(s))  Culture, blood (routine x 2)     Status: None   Collection Time: 10/27/16  8:42 PM  Result Value Ref Range Status   Specimen Description BLOOD RIGHT HAND  Final   Special Requests AEROBIC BOTTLE ONLY 5ML  Final   Culture NO GROWTH 5 DAYS  Final   Report Status 11/02/2016 FINAL  Final  Culture, blood (routine x 2)     Status: None   Collection Time: 10/27/16  8:50 PM  Result Value Ref Range Status   Specimen Description BLOOD RIGHT HAND  Final   Special Requests BOTTLES DRAWN AEROBIC AND ANAEROBIC 5ML  Final   Culture NO GROWTH 5 DAYS  Final   Report Status 11/02/2016 FINAL  Final  MRSA PCR Screening     Status: None   Collection Time: 10/27/16  9:48 PM  Result Value Ref Range Status   MRSA by PCR NEGATIVE NEGATIVE Final    Comment:        The GeneXpert MRSA Assay (FDA approved for NASAL specimens only), is one component of a comprehensive MRSA colonization surveillance program. It is not intended to diagnose MRSA infection nor to guide or monitor treatment for MRSA infections.   Gastrointestinal Panel by PCR , Stool     Status: None   Collection Time: 11/02/16  5:10 PM  Result Value Ref Range Status   Campylobacter species NOT DETECTED NOT DETECTED Final   Plesimonas shigelloides NOT DETECTED NOT DETECTED Final   Salmonella species NOT DETECTED NOT DETECTED Final   Yersinia enterocolitica NOT DETECTED NOT DETECTED Final   Vibrio species NOT DETECTED NOT DETECTED Final   Vibrio cholerae NOT DETECTED NOT DETECTED Final   Enteroaggregative E coli (EAEC) NOT DETECTED NOT DETECTED Final   Enteropathogenic E coli (EPEC) NOT DETECTED NOT DETECTED Final   Enterotoxigenic E coli (ETEC) NOT DETECTED NOT DETECTED  Final   Shiga like toxin producing E coli (STEC) NOT DETECTED NOT DETECTED Final   Shigella/Enteroinvasive E coli (EIEC) NOT DETECTED NOT DETECTED Final   Cryptosporidium NOT DETECTED NOT DETECTED Final   Cyclospora cayetanensis NOT DETECTED NOT DETECTED Final   Entamoeba histolytica NOT DETECTED NOT DETECTED Final   Giardia lamblia NOT DETECTED NOT DETECTED Final   Adenovirus F40/41 NOT DETECTED NOT DETECTED Final   Astrovirus NOT DETECTED NOT DETECTED Final   Norovirus GI/GII NOT DETECTED NOT DETECTED Final   Rotavirus A NOT DETECTED NOT DETECTED Final   Sapovirus (I, II, IV, and V) NOT DETECTED NOT DETECTED Final  C difficile quick scan w PCR reflex     Status: None   Collection Time: 11/02/16  5:10 PM  Result Value Ref Range Status   C Diff antigen NEGATIVE NEGATIVE Final   C Diff toxin NEGATIVE NEGATIVE Final   C Diff interpretation No C. difficile detected.  Final         Radiology Studies: No results found.      Scheduled Meds: . amiodarone  200 mg Oral Daily  . aspirin  81 mg Per Tube QPM  . atorvastatin  80 mg Oral QHS  . budesonide (PULMICORT) nebulizer solution  0.5 mg Nebulization BID  . chlorhexidine  15 mL Mouth Rinse BID  . feeding supplement (ENSURE ENLIVE)  237 mL Oral TID WC  . Gerhardt's butt cream   Topical TID  . insulin aspart  0-20 Units Subcutaneous TID WC  . insulin aspart  0-5 Units Subcutaneous QHS  . insulin aspart  8 Units Subcutaneous TID WC  . insulin glargine  16 Units Subcutaneous Daily  . ipratropium  0.5 mg Nebulization TID  . levalbuterol  0.63 mg Nebulization TID  . mouth rinse  15 mL Mouth Rinse q12n4p  . metoprolol tartrate  12.5 mg Oral BID  . Warfarin - Pharmacist Dosing Inpatient   Does not apply q1800  . white petrolatum   Topical Daily   Continuous Infusions: . heparin 1,200 Units/hr (11/05/16 1816)     LOS: 10 days    Time spent: 40 minutes    Drayson Dorko, Roselind MessierURTIS J, MD [facility] Pager [phone]   If  7PM-7AM, please contact night-coverage [email or web address] Password [email or web address] 11/06/2016, 8:23 AM

## 2016-11-07 DIAGNOSIS — J9621 Acute and chronic respiratory failure with hypoxia: Secondary | ICD-10-CM

## 2016-11-07 LAB — CBC
HCT: 32.5 % — ABNORMAL LOW (ref 39.0–52.0)
Hemoglobin: 10.4 g/dL — ABNORMAL LOW (ref 13.0–17.0)
MCH: 30.1 pg (ref 26.0–34.0)
MCHC: 32 g/dL (ref 30.0–36.0)
MCV: 93.9 fL (ref 78.0–100.0)
PLATELETS: 322 10*3/uL (ref 150–400)
RBC: 3.46 MIL/uL — AB (ref 4.22–5.81)
RDW: 14.9 % (ref 11.5–15.5)
WBC: 11.5 10*3/uL — ABNORMAL HIGH (ref 4.0–10.5)

## 2016-11-07 LAB — MAGNESIUM: MAGNESIUM: 2.2 mg/dL (ref 1.7–2.4)

## 2016-11-07 LAB — GLUCOSE, CAPILLARY
GLUCOSE-CAPILLARY: 114 mg/dL — AB (ref 65–99)
GLUCOSE-CAPILLARY: 160 mg/dL — AB (ref 65–99)
Glucose-Capillary: 154 mg/dL — ABNORMAL HIGH (ref 65–99)
Glucose-Capillary: 150 mg/dL — ABNORMAL HIGH (ref 65–99)

## 2016-11-07 LAB — FECAL LACTOFERRIN, QUANT: LACTOFERRIN, FECAL, QUANT.: 21.3 ug/mL — AB (ref 0.00–7.24)

## 2016-11-07 LAB — HEPARIN LEVEL (UNFRACTIONATED): Heparin Unfractionated: 0.62 IU/mL (ref 0.30–0.70)

## 2016-11-07 LAB — PROTIME-INR
INR: 1.54
Prothrombin Time: 18.6 seconds — ABNORMAL HIGH (ref 11.4–15.2)

## 2016-11-07 MED ORDER — WARFARIN SODIUM 7.5 MG PO TABS
7.5000 mg | ORAL_TABLET | Freq: Once | ORAL | Status: AC
  Administered 2016-11-07: 7.5 mg via ORAL
  Filled 2016-11-07: qty 1

## 2016-11-07 NOTE — Progress Notes (Signed)
Physical Therapy Note- Late Entry    11/06/16 0900  PT Visit Information  Last PT Received On 11/07/16  Assistance Needed +2  History of Present Illness pt presents with Respiratory Failure and Encephalopathy.  pt with hx of COPD, CHF, CKD, and A-fib.    Subjective Data  Patient Stated Goal pt did not state.  Precautions  Precautions Fall  Restrictions  Weight Bearing Restrictions No  Pain Assessment  Pain Assessment No/denies pain  Cognition  Arousal/Alertness Awake/alert  Behavior During Therapy Flat affect  Overall Cognitive Status No family/caregiver present to determine baseline cognitive functioning  Bed Mobility  Overal bed mobility Needs Assistance  Bed Mobility Rolling  Rolling Mod assist  General bed mobility comments cues for UE use.  pt does participate with rolling and utilizes bed rails to A.    Transfers  General transfer comment Up to chair with Maximove  Balance  Overall balance assessment Needs assistance  Sitting-balance support Bilateral upper extremity supported;Feet supported  Sitting balance-Leahy Scale Poor  Sitting balance - Comments pt leans to L side with UE support and back support in chair.  Postural control Left lateral lean  PT - End of Session  Equipment Utilized During Treatment Oxygen  Activity Tolerance Patient tolerated treatment well  Patient left in chair;with call bell/phone within reach;with chair alarm set  Nurse Communication Mobility status;Need for lift equipment  PT - Assessment/Plan  PT Plan Current plan remains appropriate  PT Frequency (ACUTE ONLY) Min 2X/week  Follow Up Recommendations SNF  PT equipment None recommended by PT  PT Goal Progression  Progress towards PT goals Progressing toward goals  Acute Rehab PT Goals  PT Goal Formulation Patient unable to participate in goal setting  Time For Goal Achievement 11/17/16  Potential to Achieve Goals Good  PT Time Calculation  PT Start Time (ACUTE ONLY) 0855  PT Stop  Time (ACUTE ONLY) 0916  PT Time Calculation (min) (ACUTE ONLY) 21 min  PT General Charges  $$ ACUTE PT VISIT 1 Procedure  PT Treatments  $Therapeutic Activity 8-22 mins   Havard Radigan, PT [phone]

## 2016-11-07 NOTE — Progress Notes (Signed)
Occupational Therapy Evaluation Patient Details Name: Dylan ClinesDouglas E Ruest MRN: [id number] DOB: 1951/01/06 Today's Date: 11/07/2016    History of Present Illness pt presents with Respiratory Failure and Encephalopathy.  pt with hx of COPD, CHF, CKD, and A-fib.     Clinical Impression   PTA, pt apparently undergoing rehab at SNF from previous hospitalization. Prior to that, pt reports he was independent. Pt with significant functional decline due to below deficits. Will follow acutely to maximize functional level of independence to facilitate DC to SNF for rehab. Recommend nsg use maximove to get pt OOB daily.     Follow Up Recommendations  SNF;Supervision/Assistance - 24 hour    Equipment Recommendations  None recommended by OT    Recommendations for Other Services       Precautions / Restrictions Precautions Precautions: Fall Precaution Comments: flexiseal; skiin integrity Restrictions Weight Bearing Restrictions: No      Mobility Bed Mobility Overal bed mobility: Needs Assistance Bed Mobility: Rolling Rolling: Mod assist   Supine to sit: Max assist        Transfers                 General transfer comment: not assessed    Balance     Sitting balance-Leahy Scale: Poor                                      ADL Overall ADL's : Needs assistance/impaired     Grooming: Minimal assistance;Bed level   Upper Body Bathing: Moderate assistance;Bed level   Lower Body Bathing: Maximal assistance;Bed level               Toileting- Clothing Manipulation and Hygiene: Total assistance (rectal foley)               Vision Additional Comments: will further assess   Perception     Praxis      Pertinent Vitals/Pain Pain Assessment: Faces Faces Pain Scale: Hurts little more Pain Location: breathing/ general discomfort Pain Descriptors / Indicators: Discomfort;Grimacing Pain Intervention(s): Limited activity within patient's  tolerance     Hand Dominance Right   Extremity/Trunk Assessment Upper Extremity Assessment Upper Extremity Assessment: Generalized weakness   Lower Extremity Assessment Lower Extremity Assessment: Generalized weakness   Cervical / Trunk Assessment Cervical / Trunk Assessment: Kyphotic   Communication Communication Communication: No difficulties   Cognition Arousal/Alertness: Awake/alert Behavior During Therapy: Flat affect Overall Cognitive Status: No family/caregiver present to determine baseline cognitive functioning  Pt following 1 steps commands Pt appropirate conversation ? Slow processing States xanax helps his breathing by reducing his anxiety                    General Comments       Exercises       Shoulder Instructions      Home Living Family/patient expects to be discharged to:: Skilled nursing facility                                        Prior Functioning/Environment Level of Independence: Needs assistance        Comments: Pt was at facility for rehab.  Before then he lived at home with wife and was independent with ADL        OT Problem List: Decreased strength;Decreased activity tolerance;Impaired  balance (sitting and/or standing);Decreased cognition;Decreased safety awareness;Decreased knowledge of use of DME or AE;Cardiopulmonary status limiting activity;Pain;Increased edema   OT Treatment/Interventions: Self-care/ADL training;Therapeutic exercise;Energy conservation;DME and/or AE instruction;Therapeutic activities;Cognitive remediation/compensation;Patient/family education;Balance training    OT Goals(Current goals can be found in the care plan section) Acute Rehab OT Goals Patient Stated Goal: to get better OT Goal Formulation: With patient Time For Goal Achievement: 11/21/16 Potential to Achieve Goals: Good ADL Goals Pt Will Perform Grooming: with set-up;sitting Pt Will Perform Upper Body Bathing: with  set-up;with supervision;sitting Pt/caregiver will Perform Home Exercise Program: Increased strength;Both right and left upper extremity;With minimal assist Additional ADL Goal #1: Pt will maintain midline postural control x 10 min sitting EOB with min A in preparation for ADL  OT Frequency: Min 2X/week   Barriers to D/C:            Co-evaluation              End of Session Equipment Utilized During Treatment: Oxygen Nurse Communication: Mobility status  Activity Tolerance: Patient limited by fatigue Patient left: in bed;with call bell/phone within reach   Time: 1134-1147 OT Time Calculation (min): 13 min Charges:  OT General Charges $OT Visit: 1 Procedure OT Evaluation $OT Eval Moderate Complexity: 1 Procedure G-Codes:    Meghna Hagmann,HILLARY 11/07/2016, 1:43 PM   [facility] Hospitalil[facility] Patriciaann Rabanal, OTR/L  [phone] 11/07/2016

## 2016-11-07 NOTE — Progress Notes (Signed)
Spoke with IV team regarding removal of single lumen PICC line. IV team made point that pt has heparin running. Spoke with Mitchel HonourP. Joseph, MD regarding keeping single lumen PICC line at this time. Verbal order received to keep both single and double lumen PICCs in place. Will continue to monitor.

## 2016-11-07 NOTE — Progress Notes (Addendum)
PROGRESS NOTE    Dylan ClinesDouglas E Fabry  EAV:[id number] DOB: 07-24-1951 DOA: 10/27/2016 PCP: Crist FatVAN EYK, JASON, MD   Brief Narrative:  65 y/o WM PMHx Chronic Systolic CHF (EF 78-29%25-30%), Atrial Fibrillation with RVR on anticoagulation, HTN, HLD, Chronic Respiratory disease (chart carries dx of COPD) with Chronic Hypoxic and Hypercapnic respiratory failure, OSA. DM type II uncontrolled with complication. [facility] with progressively worsening dyspnea. He apparently had stopped his medications (which include lasix). He also had AF with RVR, with an attempt at cardioversion on 11/10 which was unsuccessful. He was rate (but not rhythm) controlled with amio and digoxin, both of which appeared to be new medications for him. He developed fever on 11/17, and CXR demonstrated RLL consolidation. He was initially treated with levofloxacin and vanc, but when his blood culture grew staph epi, it appears that he was narrowed to vanc only. He also had a steadily increasing creatinine from baseline <1 to ~3.0 on [facility]. [facility], and a PICC line was placed on the day of discharge. His wife spoke with him after the procedure, and said he was "out of head" yesterday afternoon because of the sedation he received for the procedure. On the morning of 11/25, he was found to be poorly responsive and EMS was called. [facility] than [facility]. He found to be in acute on chronic hypercarbic respiratory failure. He was started on BiPAP, and after a few hours, he did have some improvement in his mental status and blood gas.  Since admission treated for COPD exacerbation/HCAP, now with profuse diarrhea [facility]6  Subjective: Breathing ok, still having a lot of diarrhea  Assessment & Plan:  Acute on chronic hypercarbic / hypoxic respiratory failure -due to COPD/CHF, improved, see  below   Metabolic Encephalopathy -Multifactorial to include Hypercarbia, COPD, HCAP -Resolving -SNF recommended by PT  COPD exacerbation/HCAP -Completed 7 day course antibiotics, stopped 12/1 -Pulmicort nebulizer BID -continue Atrovent/Xopenex -stable  Chronic Systolic CHF/Cardiorenal syndrome  -EF 40-45%, currently on Metoprolol 12.5 mg BID -Digoxin on hold secondary to renal failure -holding PO lasix due to diarrhea, resume in 1-2days  Atrial Fibrillation with RVR -Chadsvasc schore >3 - rate controlled -continue metoprolol and amiodarone -on warfarin and Heparin per pharmacy, INR 1.5 today  Demand ischemia -Most likely combination of hypoxic respiratory failure, A. fib with RVR, renal failure.  -ECHO with diffuse hypokinesis, EF improved from prior known EF of 25-30%  Acute renal failure? Unknown baseline -Improving -due to cardiorenal syndrome, recent Vancomycin for contaminant staph epi at OSH  DM Type 2 Controlled With complication -Hba1c6.7 -SSI, stable now  Positive culture (Staph Epi) in blood - 1/2 cultures, likely contaminant  -At outside hospital, however blood culture here negative  Diarrhea -ABx associated likely -C. difficile/GI panel negative -non infectious, increase imodium to QID - remove flexiseal when diarrhea improved  DVT prophylaxis: Heparin per pharmacy--> Coumadin Code Status: Partial Family Communication: None Disposition Plan: SNF in 1-2days   [facility]  [facility]  Procedures/Significant Events:  11/27 echocardiogram:Left ventricle: mildly dilated. -LVEF =40% to 45%. Diffuse hypokinesis.  11/25  started on BiPAP, [facility]   Cultures 11/25 blood 2 negative 11/25 MRSA by PCR negative    12/1 fecal lactoferrin pending 12/1 gastrointestinal panel negative 12/1 C. difficile panel negative   Antimicrobials: Vanc started at OSH 11/17 >>stopped?? Levofloxacin 11/17 >> ?? (~11/20?)  Cefepime 11/25 >>  12/1   Devices    LINES / TUBES:  Rt PICC placed at OSH 11/24 >>    Continuous Infusions: . heparin 1,200 Units/hr (11/07/16 0400)     Objective: Vitals:   11/07/16 0025 11/07/16 0431 11/07/16 0801 11/07/16 0802  BP: (!) 113/47 117/71    Pulse: 95 83    Resp: 18 18    Temp: 98.5 F (36.9 C) 97.5 F (36.4 C)    TempSrc: Oral Oral    SpO2: 97% 94% 94% 94%  Weight:  73.5 kg (162 lb 0.6 oz)    Height:        Intake/Output Summary (Last 24 hours) at 11/07/16 16100833 Last data filed at 11/07/16 [id number]  Gross per 24 hour  Intake              361 ml  Output              275 ml  Net               86 ml   Filed Weights   11/06/16 1815 11/06/16 2301 11/07/16 0431  Weight: 73 kg (161 lb) 73 kg (161 lb) 73.5 kg (162 lb 0.6 oz)    Examination:  General:A/O 4, chronically ill appearing, No acute respiratory distress HEENT: no JVD noted Lungs: Clear to auscultation bilaterally without wheezes or crackles Cardiovascular: Regular rate and rhythm without murmur gallop or rub normal S1 and S2 Abdomen: soft, NT, BS present, flexiseal noted Extremities: No significant cyanosis, clubbing, or edema bilateral lower extremities Skin: Negative rashes, lesions, ulcers Psychiatric:  Negative depression, negative anxiety, negative fatigue, negative mania  Central nervous system:  Cranial nerves II through XII intact, tongue/uvula midline, all extremities muscle strength 5/5, sensation intact throughout,negative dysarthria, negative expressive aphasia, negative receptive aphasia.  .     Data Reviewed: Care during the described time interval was provided by me .  I have reviewed this patient's available data, including medical history, events of note, physical examination, and all test results as part of my evaluation. I have personally reviewed and interpreted all radiology studies.  CBC:  Recent Labs Lab 11/03/16 0121 11/04/16 0440 11/05/16 0400 11/06/16 0545 11/07/16 0356  WBC  14.7* 18.6* 18.9* 12.4* 11.5*  HGB 9.9* 9.9* 10.4* 9.9* 10.4*  HCT 30.6* 31.5* 33.6* 31.7* 32.5*  MCV 93.0 94.9 95.5 96.1 93.9  PLT 351 367 387 321 322   Basic Metabolic Panel:  Recent Labs Lab 10/31/16 1700 11/01/16 0530 11/02/16 0515 11/03/16 0121 11/03/16 0721 11/04/16 0440 11/04/16 0920 11/05/16 0400 11/06/16 0545 11/07/16 0356  NA  --  143 142  140  --  141  --  141  --  140  --   K  --  3.4* 5.3*  5.1  --  4.4  --  5.0  --  4.0  --   CL  --  103 102  100*  --  102  --  103  --  103  --   CO2  --  31 28  30   --  30  --  31  --  31  --   GLUCOSE  --  138* 436*  436*  --  349*  --  383*  --  95  --   BUN  --  45* 55*  54*  --  70*  --  79*  --  49*  --   CREATININE  --  2.94* 2.87*  2.73*  --  2.30*  --  1.91*  --  1.37*  --   CALCIUM  --  8.1* 7.9*  8.0*  --  8.1*  --  8.1*  --  8.5*  --   MG 1.7  --  1.4*  1.4* 2.1  --  1.9  --  1.6* 1.5* 2.2  PHOS 2.4*  --   --   --   --   --   --   --   --   --    GFR: Estimated Creatinine Clearance: 51.9 mL/min (by C-G formula based on SCr of 1.37 mg/dL (H)). Liver Function Tests:  Recent Labs Lab 11/02/16 0515 11/04/16 0920 11/06/16 0545  AST 22 40 124*  ALT 11* 23 88*  ALKPHOS 103 108 102  BILITOT 0.4 0.5 0.5  PROT 4.7* 4.7* 4.2*  ALBUMIN 1.9* 2.1* 2.1*   No results for input(s): LIPASE, AMYLASE in the last 168 hours. No results for input(s): AMMONIA in the last 168 hours. Coagulation Profile:  Recent Labs Lab 11/03/16 0121 11/04/16 0440 11/05/16 0400 11/06/16 0545 11/07/16 0356  INR 1.33 1.27 1.32 1.38 1.54   Cardiac Enzymes:  Recent Labs Lab 11/02/16 2041 11/03/16 0121 11/03/16 0721  TROPONINI 0.11* 0.14* 0.13*   BNP (last 3 results) No results for input(s): PROBNP in the last 8760 hours. HbA1C: No results for input(s): HGBA1C in the last 72 hours. CBG:  Recent Labs Lab 11/06/16 0745 11/06/16 1130 11/06/16 1637 11/06/16 2115 11/07/16 0806  GLUCAP 101* 167* 93 104* 114*   Lipid  Profile: No results for input(s): CHOL, HDL, LDLCALC, TRIG, CHOLHDL, LDLDIRECT in the last 72 hours. Thyroid Function Tests: No results for input(s): TSH, T4TOTAL, FREET4, T3FREE, THYROIDAB in the last 72 hours. Anemia Panel:  Recent Labs  11/06/16 0545  VITAMINB12 938*  FOLATE 5.6*  FERRITIN 204  TIBC 210*  IRON 74  RETICCTPCT 2.1   Urine analysis:    Component Value Date/Time   COLORURINE YELLOW 10/27/2016 1558   APPEARANCEUR CLOUDY (A) 10/27/2016 1558   LABSPEC 1.015 10/27/2016 1558   PHURINE 5.0 10/27/2016 1558   GLUCOSEU NEGATIVE 10/27/2016 1558   HGBUR NEGATIVE 10/27/2016 1558   BILIRUBINUR NEGATIVE 10/27/2016 1558   KETONESUR NEGATIVE 10/27/2016 1558   PROTEINUR NEGATIVE 10/27/2016 1558   NITRITE NEGATIVE 10/27/2016 1558   LEUKOCYTESUR NEGATIVE 10/27/2016 1558   Sepsis Labs: @LABRCNTIP (procalcitonin:4,lacticidven:4)  ) Recent Results (from the past 240 hour(s))  Gastrointestinal Panel by PCR , Stool     Status: None   Collection Time: 11/02/16  5:10 PM  Result Value Ref Range Status   Campylobacter species NOT DETECTED NOT DETECTED Final   Plesimonas shigelloides NOT DETECTED NOT DETECTED Final   Salmonella species NOT DETECTED NOT DETECTED Final   Yersinia enterocolitica NOT DETECTED NOT DETECTED Final   Vibrio species NOT DETECTED NOT DETECTED Final   Vibrio cholerae NOT DETECTED NOT DETECTED Final   Enteroaggregative E coli (EAEC) NOT DETECTED NOT DETECTED Final   Enteropathogenic E coli (EPEC) NOT DETECTED NOT DETECTED Final   Enterotoxigenic E coli (ETEC) NOT DETECTED NOT DETECTED Final   Shiga like toxin producing E coli (STEC) NOT DETECTED NOT DETECTED Final   Shigella/Enteroinvasive E coli (EIEC) NOT DETECTED NOT DETECTED Final   Cryptosporidium NOT DETECTED NOT DETECTED Final   Cyclospora cayetanensis NOT DETECTED NOT DETECTED Final   Entamoeba histolytica NOT DETECTED NOT DETECTED Final   Giardia lamblia NOT DETECTED NOT DETECTED Final    Adenovirus F40/41 NOT DETECTED NOT DETECTED Final  Astrovirus NOT DETECTED NOT DETECTED Final   Norovirus GI/GII NOT DETECTED NOT DETECTED Final   Rotavirus A NOT DETECTED NOT DETECTED Final   Sapovirus (I, II, IV, and V) NOT DETECTED NOT DETECTED Final  C difficile quick scan w PCR reflex     Status: None   Collection Time: 11/02/16  5:10 PM  Result Value Ref Range Status   C Diff antigen NEGATIVE NEGATIVE Final   C Diff toxin NEGATIVE NEGATIVE Final   C Diff interpretation No C. difficile detected.  Final         Radiology Studies: No results found.      Scheduled Meds: . amiodarone  200 mg Oral Daily  . aspirin  81 mg Per Tube QPM  . atorvastatin  80 mg Oral QHS  . budesonide (PULMICORT) nebulizer solution  0.5 mg Nebulization BID  . chlorhexidine  15 mL Mouth Rinse BID  . feeding supplement (ENSURE ENLIVE)  237 mL Oral TID WC  . Gerhardt's butt cream   Topical TID  . insulin aspart  0-20 Units Subcutaneous TID WC  . insulin aspart  0-5 Units Subcutaneous QHS  . ipratropium  0.5 mg Nebulization TID  . levalbuterol  0.63 mg Nebulization TID  . loperamide  2 mg Oral QID  . mouth rinse  15 mL Mouth Rinse q12n4p  . metoprolol tartrate  12.5 mg Oral BID  . warfarin  7.5 mg Oral ONCE-1800  . Warfarin - Pharmacist Dosing Inpatient   Does not apply q1800  . white petrolatum   Topical Daily   Continuous Infusions: . heparin 1,200 Units/hr (11/07/16 0400)     LOS: 11 days    Time spent: 40 minutes    Zannie Cove, MD [facility] Pager [phone]  If 7PM-7AM, please contact night-coverage [email or web address] Password [facility] 11/07/2016, 8:33 AM

## 2016-11-07 NOTE — Progress Notes (Signed)
Pt wife, Lanora Manislizabeth called for an update. This RN explained that I have to get consent from pt to provide update. Pt stated that he would talk to his wife. Pt told his wife on the phone "it takes them too long to do anything here, I asked for xanax 3 hours ago and still haven't got it." After pt hung up phone, this RN reminded pt that he asked for xanax at 1200. This RN had already told pt this and let him know that I would bring his xanax when it was time at 1334. Pt verbalized understanding both times. Will continue to monitor.

## 2016-11-07 NOTE — Progress Notes (Signed)
Patient transferred from Step down to 3e. Vitals stable. Patient oriented to room . Call bell within reach. Will continue to monitor. Petra KubaErica Chukwuemeka Artola, RN 11/07/2016 7:24 AM

## 2016-11-07 NOTE — Progress Notes (Signed)
ANTICOAGULATION CONSULT NOTE - Follow Up Consult  Pharmacy Consult for Heparin+Coumadin Indication: atrial fibrillation  Allergies  Allergen Reactions  . Penicillins Rash    No other information available at this time    Patient Measurements: Height: 5' 7.5" (171.5 cm) Weight: 162 lb 0.6 oz (73.5 kg) IBW/kg (Calculated) : 67.25 Heparin Dosing Weight: 83.6 kg  Vital Signs: Temp: 97.5 F (36.4 C) (12/06 0431) Temp Source: Oral (12/06 0431) BP: 117/71 (12/06 0431) Pulse Rate: 83 (12/06 0431)   Assessment: 65 yo M on Coumadin 7.5mg  daily PTA for Afib. [facility]clear [facility]. Bridging with heparin gtt. Last HL therapeutic at 0.62. Restarted Coumadin on 12/4. Last INR up to 1.54. Hgb low but stable at 10.4, plts wnl. No s/s of bleed.   Goal of Therapy:  Heparin level 0.3-0.7 units/ml  INR 2.0-3.0 Monitor platelets by anticoagulation protocol: Yes   Plan:  Continue heparin at 1200 units/hr Give Coumadin 7.5mg  PO x 1 tonight Monitor daily HL / INR, CBC, s/s of bleed  Enzo BiNathan Coby [place], PharmD, [facility] Clinical Pharmacist Pager [phone] 11/07/2016 8:29 AM

## 2016-11-07 NOTE — Clinical Social Work Note (Signed)
Patient now fully oriented. CSW met with patient and he confirmed plan to return to [facility].  Sarah Boswell, CSW [phone]  

## 2016-11-07 NOTE — Progress Notes (Signed)
Pt has rectal tube in place. Received verbal order from Mitchel HonourP. Joseph, MD. To keep rectal tube in place. Will continue to monitor.

## 2016-11-08 ENCOUNTER — Encounter (HOSPITAL_COMMUNITY): Payer: Self-pay

## 2016-11-08 HISTORY — PX: OTHER SURGICAL HISTORY: SHX169

## 2016-11-08 LAB — COMPREHENSIVE METABOLIC PANEL
ALBUMIN: 2.2 g/dL — AB (ref 3.5–5.0)
ALK PHOS: 133 U/L — AB (ref 38–126)
ALT: 65 U/L — ABNORMAL HIGH (ref 17–63)
ANION GAP: 7 (ref 5–15)
AST: 48 U/L — ABNORMAL HIGH (ref 15–41)
BUN: 31 mg/dL — ABNORMAL HIGH (ref 6–20)
CO2: 30 mmol/L (ref 22–32)
Calcium: 8.6 mg/dL — ABNORMAL LOW (ref 8.9–10.3)
Chloride: 101 mmol/L (ref 101–111)
Creatinine, Ser: 1.39 mg/dL — ABNORMAL HIGH (ref 0.61–1.24)
GFR calc Af Amer: >60 mL/min (ref >60)
GFR calc non Af Amer: 52 mL/min — ABNORMAL LOW (ref >60)
GLUCOSE: 154 mg/dL — AB (ref 65–99)
POTASSIUM: 4.2 mmol/L (ref 3.5–5.1)
SODIUM: 138 mmol/L (ref 135–145)
Total Bilirubin: 0.4 mg/dL (ref 0.3–1.2)
Total Protein: 4.6 g/dL — ABNORMAL LOW (ref 6.5–8.1)

## 2016-11-08 LAB — CBC
HCT: 32.7 % — ABNORMAL LOW (ref 39.0–52.0)
Hemoglobin: 10.4 g/dL — ABNORMAL LOW (ref 13.0–17.0)
MCH: 29.8 pg (ref 26.0–34.0)
MCHC: 31.8 g/dL (ref 30.0–36.0)
MCV: 93.7 fL (ref 78.0–100.0)
PLATELETS: 313 10*3/uL (ref 150–400)
RBC: 3.49 MIL/uL — ABNORMAL LOW (ref 4.22–5.81)
RDW: 14.9 % (ref 11.5–15.5)
WBC: 10.6 10*3/uL — ABNORMAL HIGH (ref 4.0–10.5)

## 2016-11-08 LAB — PROTIME-INR
INR: 2.15
Prothrombin Time: 24.4 seconds — ABNORMAL HIGH (ref 11.4–15.2)

## 2016-11-08 LAB — GLUCOSE, CAPILLARY
GLUCOSE-CAPILLARY: 141 mg/dL — AB (ref 65–99)
GLUCOSE-CAPILLARY: 136 mg/dL — AB (ref 65–99)
Glucose-Capillary: 167 mg/dL — ABNORMAL HIGH (ref 65–99)
Glucose-Capillary: 151 mg/dL — ABNORMAL HIGH (ref 65–99)
Glucose-Capillary: 133 mg/dL — ABNORMAL HIGH (ref 65–99)

## 2016-11-08 LAB — HEPARIN LEVEL (UNFRACTIONATED): Heparin Unfractionated: 0.6 [IU]/mL (ref 0.30–0.70)

## 2016-11-08 LAB — MAGNESIUM: MAGNESIUM: 1.6 mg/dL — AB (ref 1.7–2.4)

## 2016-11-08 MED ORDER — WARFARIN SODIUM 7.5 MG PO TABS
7.5000 mg | ORAL_TABLET | Freq: Once | ORAL | Status: AC
  Administered 2016-11-08: 7.5 mg via ORAL
  Filled 2016-11-08: qty 1

## 2016-11-08 MED ORDER — WHITE PETROLATUM GEL
Freq: Two times a day (BID) | Status: DC
  Administered 2016-11-08 – 2016-11-13 (×10): via TOPICAL
  Filled 2016-11-08 (×3): qty 28.35

## 2016-11-08 MED ORDER — FUROSEMIDE 20 MG PO TABS
20.0000 mg | ORAL_TABLET | Freq: Every day | ORAL | Status: DC
  Administered 2016-11-08 – 2016-11-13 (×5): 20 mg via ORAL
  Filled 2016-11-08 (×5): qty 1

## 2016-11-08 NOTE — Progress Notes (Signed)
ANTICOAGULATION CONSULT NOTE - Follow Up Consult  Pharmacy Consult for Heparin+Coumadin Indication: atrial fibrillation  Allergies  Allergen Reactions  . Penicillins Rash    No other information available at this time    Patient Measurements: Height: 5' 7.5" (171.5 cm) Weight: 172 lb (78 kg) IBW/kg (Calculated) : 67.25 Heparin Dosing Weight: 83.6 kg  Vital Signs: Temp: 97.6 F (36.4 C) (12/07 0500) Temp Source: Oral (12/07 0500) BP: 102/57 (12/07 0500) Pulse Rate: 95 (12/07 0500)   Assessment: 65 yo M on Coumadin 7.5mg  daily PTA for Afib. [facility]. Bridging with heparin gtt. Last HL therapeutic at 0.6. Restarted Coumadin on 12/4. Last INR therapeutic up to 2.15. Hgb low but stable at 10.4, plts wnl. No s/s of bleed.   Goal of Therapy:  Heparin level 0.3-0.7 units/ml  INR 2.0-3.0 Monitor platelets by anticoagulation protocol: Yes   Plan:  Stop heparin gtt with therapeutic INR Give Coumadin 7.5mg  PO x 1 tonight Monitor daily HL / INR, CBC, s/s of bleed  Dylan Hubbard, PharmD, [facility] Clinical Pharmacist Pager [phone] 11/08/2016 7:33 AM

## 2016-11-08 NOTE — Consult Note (Signed)
WOC Nurse wound follow up Wound type: Unstageable Pressure injury follow up MASD buttocks follow up Measurement: Forehead: 0.2cm x 1.5cm x 0cm Nose: 2.5cm x 0.5cm x 0cm  Temple has healed MASD is improved, some mild redness noted on the sacral area, but blanchable Wound bed: nose and forehead, 100% eschar/forehead more like scab Drainage (amount, consistency, odor) none Periwound: intact  Dressing procedure/placement/frequency: Continue Vaseline to the facial wounds, will increase frequency of application to BID.  Continue barrier cream to the buttocks.  Flexiseal fecal containment device still in place.  Patient is incontinent of urine.   Low air loss mattress for moisture management.  WOC Nurse team will follow along with you for weekly wound assessments.  Please notify me of any acute changes in the wounds or any new areas of concerns Adysson Revelle [facility] MSN, RN,CWOCN, CNS [phone]

## 2016-11-08 NOTE — Progress Notes (Signed)
Speech Language Pathology Treatment: Dysphagia  Patient Details Name: Dylan Hubbard MRN: [id number] DOB: Jan 16, 1951 Today's Date: 11/08/2016 Time: 9290-9030 SLP Time Calculation (min) (ACUTE ONLY): 15 min  Assessment / Plan / Recommendation Clinical Impression  F/u diet tolerance assessment complete. Patient able to consume thin liquid and dysphagia 3 trials with a functional oropharyngeal swallow with seemingly good airway protection. One cough noted post intake with large subsequent boluses of thin liquid indicative of aspiration however cough strong and suspected successful in clearing the airway. Suspect that fatigue/lethargy playing a role in above. Oral phase remains moderately delayed with patient stating he was unable to masticate hard dysphagia 3 textures due to missing dentition. At this time, given endentulous state and current lethargy/AMS, dysphagia 2 solids remain appropriate. No further SLP needs indicated in the acute care setting as patient appears to be tolerating diet. Patient may advance solids as tolerated upon request with improved mentation. F/u SLP services at [facility] after discharge may be beneficial in assisting with this if needed.    HPI HPI: 65 yo man with COPD, dCHF, CKD, fecently at [facility] with resp `failure, volume overload, A Fib + RVR. Developed RLL HCAP. Was discharged 11/24 but was quickly readmitted with multisystem failure - encephalopathy, combined hypoxic and hypercapneic resp failure, acute on chronic renal failure. He was treated w BiPAP with some stabilization.      SLP Plan  All goals met     Recommendations  Diet recommendations: Dysphagia 2 (fine chop);Thin liquid Liquids provided via: Cup;Straw Medication Administration: Crushed with puree Supervision: Patient able to self feed;Full supervision/cueing for compensatory strategies Compensations: Slow rate;Small sips/bites;Minimize environmental distractions;Multiple dry swallows after each  bite/sip Postural Changes and/or Swallow Maneuvers: Seated upright 90 degrees;Upright 30-60 min after meal                Oral Care Recommendations: Oral care BID Follow up Recommendations: Skilled Nursing facility Plan: All goals met       Dylan Hubbard, [place] [phone]    Dylan Hubbard Dylan Hubbard 11/08/2016, 11:57 AM

## 2016-11-08 NOTE — Progress Notes (Signed)
Changed foam dressing on sacrum. Patient tolerated well. Patient reports no pain at this time. Will continue to monitor. Petra Kubarica Somaly Marteney RN 11/08/2016 6:48 AM

## 2016-11-08 NOTE — Progress Notes (Signed)
PROGRESS NOTE    Dylan Hubbard  ZOX:[id number] DOB: 06-07-1951 DOA: 10/27/2016 PCP: Crist Fat, MD   Brief Narrative:  65 y/o ?  Chronic Systolic CHF (EF 81-19%),  Atrial Fibrillation with RVR on anticoagulation,  HTN,  HLD,  Chronic Respiratory disease  (chart carries dx of COPD) with Chronic Hypoxic and Hypercapnic respiratory failure,  OSA.  DM type II uncontrolled with complication. Presented to [facility] on 11/6 with progressively worsening dyspnea. He apparently had stopped his medications (which include lasix). He also had AF with RVR, with an attempt at cardioversion on 11/10 which was unsuccessful. He was rate (but not rhythm) controlled with amio and digoxin, both of which appeared to be new medications for him.  fever on 11/17, and CXR demonstrated RLL consolidation. He was initially treated with levofloxacin and vanc, but when his blood culture grew staph epi, it appears that he was narrowed to vanc only. He also had a steadily increasing creatinine from baseline <1 to ~3.0 on presentation to [facility]. He was discharged from [facility] on 11/24, and a PICC line was placed on the day of discharge. His wife spoke with him after the procedure, and said he was "out of head"  because of the sedation he received for the procedure.  On the morning of 11/25, he was found to be poorly responsive and EMS was called.  Rather than re-presenting to [facility], EMS brought him to [facility]. He found to be in acute on chronic hypercarbic respiratory failure. He was started on BiPAP, and after a few hours, he did have some improvement in his mental status and blood gas.  Since admission treated for COPD exacerbation/HCAP, now with profuse diarrhea Transferred from [facility] Team 1 to Floor today 12/6  Subjective:  Fair Laying in bed without any issue Not eating much No overt pain or discomfort No cp Has been oxygen ~ 1 year The last time he walked was before he went into the  hospital  Assessment & Plan:  Acute on chronic hypercarbic / hypoxic respiratory failure -due to COPD/CHF, improved, see below -continue Pulmicort 0.5 bid, Atrovent 0.5 tid and Xopenex tid   Metabolic Encephalopathy -Multifactorial to include Hypercarbia, COPD, HCAP -Resolving -SNF recommended by PT  COPD exacerbation/HCAP -Completed 7 day course antibiotics, stopped 12/1 -Pulmicort nebulizer BID -continue Atrovent/Xopenex -stable  Chronic Systolic CHF/Cardiorenal syndrome  -EF 40-45%, currently on Metoprolol 12.5 mg BID -Digoxin on hold secondary to renal failure  -holding PO lasix due to diarrhea, resumed 12/7  Atrial Fibrillation with RVR -Chad2svasc schore >3 - rate controlled -continue metoprolol 12.5 bid and amiodarone 200 qd -on warfarin and Heparin per pharmacy, INR 2.5 today  Demand ischemia -Most likely combination of hypoxic respiratory failure, A. fib with RVR, renal failure.  -ECHO with diffuse hypokinesis, EF improved from prior known EF of 25-30%  Acute renal failure? Unknown baseline -Improving -due to cardiorenal syndrome, recent Vancomycin for contaminant staph epi at OSH  DM Type 2 Controlled With complication -Hba1c6.7 -SSI, stable now, sugars in 141-167 range  Positive culture (Staph Epi) in blood - 1/2 cultures, likely contaminant  -At outside hospital, however blood culture here negative  Diarrhea -ABx associated likely -C. difficile/GI panel negative -non infectious, increase imodium to QID - remove flexiseal when diarrhea improved  DVT prophylaxis: Heparin per pharmacy--> Coumadin Code Status: Partial Family Communication: None Disposition Plan: SNF in 1-2days   Consultants:  [facility]  Procedures/Significant Events:  11/27 echocardiogram:Left ventricle: mildly dilated. -LVEF =40% to 45%.  Diffuse hypokinesis.  11/25  started on BiPAP, [facility]   Cultures 11/25 blood 2 negative 11/25 MRSA by PCR negative    12/1 fecal  lactoferrin pending 12/1 gastrointestinal panel negative 12/1 C. difficile panel negative   Antimicrobials: Vanc started at OSH 11/17 >>stopped?? Levofloxacin 11/17 >> ?? (~11/20?) Cefepime 11/25 >> 12/1   Devices    LINES / TUBES:  Rt PICC placed at OSH 11/24 >>    Continuous Infusions:    Objective: Vitals:   11/08/16 0500 11/08/16 0910 11/08/16 0913 11/08/16 0917  BP: (!) 102/57     Pulse: 95     Resp: 18     Temp: 97.6 F (36.4 C)     TempSrc: Oral     SpO2: 97% 92% 91% 90%  Weight: 78 kg (172 lb)     Height:        Intake/Output Summary (Last 24 hours) at 11/08/16 1039 Last data filed at 11/08/16 0544  Gross per 24 hour  Intake              850 ml  Output              955 ml  Net             -105 ml   Filed Weights   11/06/16 2301 11/07/16 0431 11/08/16 0500  Weight: 73 kg (161 lb) 73.5 kg (162 lb 0.6 oz) 78 kg (172 lb)    Examination:  General:A/O 4, chronically ill appearing, No acute respiratory distress HEENT: no JVD noted Lungs: Clear to auscultation bilaterally without wheezes or crackles Cardiovascular: Regular rate and rhythm without murmur gallop or rub normal S1 and S2 Abdomen: soft, NT, BS present, flexiseal noted Extremities: No significant cyanosis, clubbing, or edema bilateral lower extremities Skin: Negative rashes, lesions, ulcers Psychiatric:  Negative depression, negative anxiety, negative fatigue, negative mania  Central nervous system:  Cranial nerves II through XII intact, tongue/uvula midline, all extremities muscle strength 5/5, sensation intact throughout,negative dysarthria, negative expressive aphasia, negative receptive aphasia.  .  CBC:  Recent Labs Lab 11/04/16 0440 11/05/16 0400 11/06/16 0545 11/07/16 0356 11/08/16 0412  WBC 18.6* 18.9* 12.4* 11.5* 10.6*  HGB 9.9* 10.4* 9.9* 10.4* 10.4*  HCT 31.5* 33.6* 31.7* 32.5* 32.7*  MCV 94.9 95.5 96.1 93.9 93.7  PLT 367 387 321 322 313   Basic Metabolic  Panel:  Recent Labs Lab 11/02/16 0515  11/03/16 0721 11/04/16 0440 11/04/16 0920 11/05/16 0400 11/06/16 0545 11/07/16 0356 11/08/16 0412  NA 142  140  --  141  --  141  --  140  --   --   K 5.3*  5.1  --  4.4  --  5.0  --  4.0  --   --   CL 102  100*  --  102  --  103  --  103  --   --   CO2 28  30  --  30  --  31  --  31  --   --   GLUCOSE 436*  436*  --  349*  --  383*  --  95  --   --   BUN 55*  54*  --  70*  --  79*  --  49*  --   --   CREATININE 2.87*  2.73*  --  2.30*  --  1.91*  --  1.37*  --   --   CALCIUM 7.9*  8.0*  --  8.1*  --  8.1*  --  8.5*  --   --   MG 1.4*  1.4*  < >  --  1.9  --  1.6* 1.5* 2.2 1.6*  < > = values in this interval not displayed. GFR: Estimated Creatinine Clearance: 51.9 mL/min (by C-G formula based on SCr of 1.37 mg/dL (H)). Liver Function Tests:  Recent Labs Lab 11/02/16 0515 11/04/16 0920 11/06/16 0545  AST 22 40 124*  ALT 11* 23 88*  ALKPHOS 103 108 102  BILITOT 0.4 0.5 0.5  PROT 4.7* 4.7* 4.2*  ALBUMIN 1.9* 2.1* 2.1*   No results for input(s): LIPASE, AMYLASE in the last 168 hours. No results for input(s): AMMONIA in the last 168 hours. Coagulation Profile:  Recent Labs Lab 11/04/16 0440 11/05/16 0400 11/06/16 0545 11/07/16 0356 11/08/16 0412  INR 1.27 1.32 1.38 1.54 2.15   Cardiac Enzymes:  Recent Labs Lab 11/02/16 2041 11/03/16 0121 11/03/16 0721  TROPONINI 0.11* 0.14* 0.13*   BNP (last 3 results) No results for input(s): PROBNP in the last 8760 hours. HbA1C: No results for input(s): HGBA1C in the last 72 hours. CBG:  Recent Labs Lab 11/07/16 1141 11/07/16 1620 11/07/16 2207 11/08/16 0625 11/08/16 0748  GLUCAP 154* 160* 150* 141* 167*   Lipid Profile: No results for input(s): CHOL, HDL, LDLCALC, TRIG, CHOLHDL, LDLDIRECT in the last 72 hours. Thyroid Function Tests: No results for input(s): TSH, T4TOTAL, FREET4, T3FREE, THYROIDAB in the last 72 hours. Anemia Panel:  Recent Labs   11/06/16 0545  VITAMINB12 938*  FOLATE 5.6*  FERRITIN 204  TIBC 210*  IRON 74  RETICCTPCT 2.1   Urine analysis:    Component Value Date/Time   COLORURINE YELLOW 10/27/2016 1558   APPEARANCEUR CLOUDY (A) 10/27/2016 1558   LABSPEC 1.015 10/27/2016 1558   PHURINE 5.0 10/27/2016 1558   GLUCOSEU NEGATIVE 10/27/2016 1558   HGBUR NEGATIVE 10/27/2016 1558   BILIRUBINUR NEGATIVE 10/27/2016 1558   KETONESUR NEGATIVE 10/27/2016 1558   PROTEINUR NEGATIVE 10/27/2016 1558   NITRITE NEGATIVE 10/27/2016 1558   LEUKOCYTESUR NEGATIVE 10/27/2016 1558   Sepsis Labs: @LABRCNTIP (procalcitonin:4,lacticidven:4)  ) Recent Results (from the past 240 hour(s))  Gastrointestinal Panel by PCR , Stool     Status: None   Collection Time: 11/02/16  5:10 PM  Result Value Ref Range Status   Campylobacter species NOT DETECTED NOT DETECTED Final   Plesimonas shigelloides NOT DETECTED NOT DETECTED Final   Salmonella species NOT DETECTED NOT DETECTED Final   Yersinia enterocolitica NOT DETECTED NOT DETECTED Final   Vibrio species NOT DETECTED NOT DETECTED Final   Vibrio cholerae NOT DETECTED NOT DETECTED Final   Enteroaggregative E coli (EAEC) NOT DETECTED NOT DETECTED Final   Enteropathogenic E coli (EPEC) NOT DETECTED NOT DETECTED Final   Enterotoxigenic E coli (ETEC) NOT DETECTED NOT DETECTED Final   Shiga like toxin producing E coli (STEC) NOT DETECTED NOT DETECTED Final   Shigella/Enteroinvasive E coli (EIEC) NOT DETECTED NOT DETECTED Final   Cryptosporidium NOT DETECTED NOT DETECTED Final   Cyclospora cayetanensis NOT DETECTED NOT DETECTED Final   Entamoeba histolytica NOT DETECTED NOT DETECTED Final   Giardia lamblia NOT DETECTED NOT DETECTED Final   Adenovirus F40/41 NOT DETECTED NOT DETECTED Final   Astrovirus NOT DETECTED NOT DETECTED Final   Norovirus GI/GII NOT DETECTED NOT DETECTED Final   Rotavirus A NOT DETECTED NOT DETECTED Final   Sapovirus (I, II, IV, and V) NOT DETECTED NOT DETECTED  Final  C difficile quick scan w  PCR reflex     Status: None   Collection Time: 11/02/16  5:10 PM  Result Value Ref Range Status   C Diff antigen NEGATIVE NEGATIVE Final   C Diff toxin NEGATIVE NEGATIVE Final   C Diff interpretation No C. difficile detected.  Final         Radiology Studies: No results found.      Scheduled Meds: . amiodarone  200 mg Oral Daily  . aspirin  81 mg Per Tube QPM  . atorvastatin  80 mg Oral QHS  . budesonide (PULMICORT) nebulizer solution  0.5 mg Nebulization BID  . chlorhexidine  15 mL Mouth Rinse BID  . feeding supplement (ENSURE ENLIVE)  237 mL Oral TID WC  . Gerhardt's butt cream   Topical TID  . insulin aspart  0-20 Units Subcutaneous TID WC  . insulin aspart  0-5 Units Subcutaneous QHS  . ipratropium  0.5 mg Nebulization TID  . levalbuterol  0.63 mg Nebulization TID  . loperamide  2 mg Oral QID  . mouth rinse  15 mL Mouth Rinse q12n4p  . metoprolol tartrate  12.5 mg Oral BID  . warfarin  7.5 mg Oral ONCE-1800  . Warfarin - Pharmacist Dosing Inpatient   Does not apply q1800  . white petrolatum   Topical Daily   Continuous Infusions:    LOS: 12 days    Time spent: 20 minutes  Pleas KochJai Nelwyn Hebdon, MD [facility](P) [phone]   If 7PM-7AM, please contact night-coverage [email or web address] Password [email or web address] 11/08/2016, 10:39 AM

## 2016-11-09 ENCOUNTER — Inpatient Hospital Stay (HOSPITAL_COMMUNITY): Payer: Medicare Other

## 2016-11-09 LAB — CBC
HEMATOCRIT: 32.1 % — AB (ref 39.0–52.0)
HEMOGLOBIN: 10.4 g/dL — AB (ref 13.0–17.0)
MCH: 30.2 pg (ref 26.0–34.0)
MCHC: 32.4 g/dL (ref 30.0–36.0)
MCV: 93.3 fL (ref 78.0–100.0)
Platelets: 284 10*3/uL (ref 150–400)
RBC: 3.44 MIL/uL — AB (ref 4.22–5.81)
RDW: 15.3 % (ref 11.5–15.5)
WBC: 10.8 10*3/uL — ABNORMAL HIGH (ref 4.0–10.5)

## 2016-11-09 LAB — GLUCOSE, CAPILLARY
GLUCOSE-CAPILLARY: 119 mg/dL — AB (ref 65–99)
GLUCOSE-CAPILLARY: 182 mg/dL — AB (ref 65–99)
GLUCOSE-CAPILLARY: 153 mg/dL — AB (ref 65–99)
GLUCOSE-CAPILLARY: 106 mg/dL — AB (ref 65–99)

## 2016-11-09 LAB — MAGNESIUM: Magnesium: 1.4 mg/dL — ABNORMAL LOW (ref 1.7–2.4)

## 2016-11-09 LAB — PROTIME-INR
INR: 2.55
Prothrombin Time: 27.9 seconds — ABNORMAL HIGH (ref 11.4–15.2)

## 2016-11-09 MED ORDER — MAGNESIUM OXIDE 400 (241.3 MG) MG PO TABS
400.0000 mg | ORAL_TABLET | Freq: Every day | ORAL | Status: DC
  Administered 2016-11-09: 400 mg via ORAL
  Filled 2016-11-09: qty 1

## 2016-11-09 MED ORDER — WARFARIN SODIUM 5 MG PO TABS
5.0000 mg | ORAL_TABLET | Freq: Once | ORAL | Status: DC
  Filled 2016-11-09: qty 1

## 2016-11-09 MED ORDER — DIGOXIN 125 MCG PO TABS: 0.1250 mg | ORAL_TABLET | Freq: Every evening | ORAL | Status: DC

## 2016-11-09 MED ORDER — SODIUM CHLORIDE 0.9 % IV BOLUS (SEPSIS)
250.0000 mL | Freq: Once | INTRAVENOUS | Status: AC
  Administered 2016-11-09: 250 mL via INTRAVENOUS

## 2016-11-09 MED ORDER — METOPROLOL TARTRATE 12.5 MG HALF TABLET
12.5000 mg | ORAL_TABLET | Freq: Two times a day (BID) | ORAL | Status: DC
  Administered 2016-11-09: 12.5 mg via ORAL
  Filled 2016-11-09: qty 1

## 2016-11-09 MED ORDER — DIGOXIN 125 MCG PO TABS
0.1250 mg | ORAL_TABLET | Freq: Every evening | ORAL | Status: DC
  Administered 2016-11-09 – 2016-11-12 (×4): 0.125 mg via ORAL
  Filled 2016-11-09 (×4): qty 1

## 2016-11-09 MED ORDER — METOPROLOL TARTRATE 25 MG PO TABS
25.0000 mg | ORAL_TABLET | Freq: Two times a day (BID) | ORAL | Status: DC
  Administered 2016-11-09: 25 mg via ORAL
  Filled 2016-11-09: qty 1

## 2016-11-09 NOTE — Progress Notes (Signed)
Physical Therapy Treatment Patient Details Name: Dylan Hubbard MRN: [id number] DOB: Apr 19, 1951 Today's Date: 11/09/2016    History of Present Illness pt presents with Respiratory Failure and Encephalopathy.  pt with hx of COPD, CHF, CKD, and A-fib.      PT Comments    Pt sat EOB x 4 minutes. HR max 142. Pt requesting return to supine due to pain and fatigue. Upon return to supine, ~ 3-5 minute recovery time for HR to decrease to 117. RN notified. Pt declining maximove to recliner.  Follow Up Recommendations  SNF     Equipment Recommendations  None recommended by PT    Recommendations for Other Services       Precautions / Restrictions Precautions Precautions: Fall;Other (comment) Precaution Comments: flexiseal; skiin integrity    Mobility  Bed Mobility     Rolling: Mod assist   Supine to sit: Mod assist;+2 for safety/equipment Sit to supine: +2 for physical assistance;Mod assist   General bed mobility comments: verbal cues for sequencing, +rail  Transfers                 General transfer comment: unable to tolerate OOB. HR 142 sitting EOB.  Ambulation/Gait             General Gait Details: unable   Stairs            Wheelchair Mobility    Modified Rankin (Stroke Patients Only)       Balance   Sitting-balance support: Single extremity supported;Feet supported Sitting balance-Leahy Scale: Fair   Postural control: Left lateral lean                          Cognition Arousal/Alertness: Awake/alert Behavior During Therapy: Flat affect Overall Cognitive Status: No family/caregiver present to determine baseline cognitive functioning                      Exercises      General Comments        Pertinent Vitals/Pain Pain Assessment: Faces Faces Pain Scale: Hurts little more Pain Location: back/abdomen Pain Descriptors / Indicators: Grimacing;Discomfort Pain Intervention(s): Monitored during session     Home Living                      Prior Function            PT Goals (current goals can now be found in the care plan section) Acute Rehab PT Goals Patient Stated Goal: to get better PT Goal Formulation: Patient unable to participate in goal setting Time For Goal Achievement: 11/17/16 Potential to Achieve Goals: Good Progress towards PT goals: Progressing toward goals (slowly)    Frequency    Min 2X/week      PT Plan Current plan remains appropriate    Co-evaluation             End of Session Equipment Utilized During Treatment: Oxygen Activity Tolerance: Treatment limited secondary to medical complications (Comment) (tachy) Patient left: in bed;with call bell/phone within reach;with SCD's reapplied     Time: [id number] PT Time Calculation (min) (ACUTE ONLY): 19 min  Charges:  $Therapeutic Activity: 8-22 mins                    G Codes:      Ilda FoilGarrow, Dylan Hubbard 11/09/2016, 9:05 AM

## 2016-11-09 NOTE — Progress Notes (Signed)
Pt BP 92/34. HR 104. Pt states he doesn't feel good and "hurts all over". Paged J. Mahala MenghiniSamtani, MD to make aware.

## 2016-11-09 NOTE — Progress Notes (Signed)
PROGRESS NOTE    Dylan Hubbard  ZOX:[id number] DOB: [birth year] DOA: 10/27/2016 PCP: Crist Fat, MD   Brief Narrative:  65 y/o ?  Chronic Systolic CHF (EF 81-19%),  Atrial Fibrillation with RVR on anticoagulation,  HTN,  HLD,  Chronic Respiratory disease  (chart carries dx of COPD) with Chronic Hypoxic and Hypercapnic respiratory failure,  OSA.  DM type II uncontrolled with complication. Presented to [facility] on 11/6 with progressively worsening dyspnea. He apparently had stopped his medications (which include lasix). He also had AF with RVR, with an attempt at cardioversion on 11/10 which was unsuccessful. He was rate (but not rhythm) controlled with amio and digoxin, both of which appeared to be new medications for him.  fever on 11/17, and CXR demonstrated RLL consolidation. He was initially treated with levofloxacin and vanc, but when his blood culture grew staph epi, it appears that he was narrowed to vanc only. He also had a steadily increasing creatinine from baseline <1 to ~3.0 on presentation to [facility]. He was discharged from [facility] on 11/24, and a PICC line was placed on the day of discharge. His wife spoke with him after the procedure, and said he was "out of head"  because of the sedation he received for the procedure.  On the morning of 11/25, he was found to be poorly responsive and EMS was called.  Rather than re-presenting to [facility], EMS brought him to [facility]. He found to be in acute on chronic hypercarbic respiratory failure. He was started on BiPAP, and after a few hours, he did have some improvement in his mental status and blood gas.  Since admission treated for COPD exacerbation/HCAP, now with profuse diarrhea Transferred from [facility] Team 1 to Floor today 12/6  Subjective:  Fair Didn't feel good earlier Aching "all over" Some cough No chills no rigor Poor appetite   Assessment & Plan:  Acute on chronic hypercarbic / hypoxic respiratory  failure -due to COPD/CHF, improved, see below -continue Pulmicort 0.5 bid, Atrovent 0.5 tid and Xopenex tid   Metabolic Encephalopathy -Multifactorial to include Hypercarbia, COPD, HCAP -Resolving -SNF recommended by PT  COPD exacerbation/HCAP -Completed 7 day course antibiotics, stopped 12/1 -Pulmicort nebulizer BID -continue Atrovent/Xopenex -CXr done earlier 12/8 not suspicious for PNA-shows RLL but recently Rx for PNA.  monitor  Chronic Systolic CHF/Cardiorenal syndrome  -EF 40-45%, currently on Metoprolol 12.5 mg BID -resumed on 12/8 low dose Digoxin 0.125 -holding PO lasix due to diarrhea, resumed 12/7  Atrial Fibrillation with RVR -Chad2svasc schore >3 - rate controlled -continue metoprolol 12.5 bid and amiodarone 200 qd -on warfarin  per pharmacy, INR 2.5 today  Demand ischemia -Most likely combination of hypoxic respiratory failure, A. fib with RVR, renal failure.  -ECHO with diffuse hypokinesis, EF improved from prior known EF of 25-30%  Acute renal failure? Unknown baseline -Improving -due to cardiorenal syndrome, recent Vancomycin for contaminant staph epi at OSH  DM Type 2 Controlled With complication -Hba1c6.7 -SSI, stable now, sugars in 119-182 range  Positive culture (Staph Epi) in blood - 1/2 cultures, likely contaminant  -At outside hospital, however blood culture here negative  Diarrhea -ABx associated likely -C. difficile/GI panel negative -non infectious, increase imodium to QID - remove flexiseal when diarrhea improved  DVT prophylaxis: Heparin per pharmacy--> Coumadin Code Status: Partial Family Communication: None Disposition Plan: SNF soon   Consultants:  PCCM  Procedures/Significant Events:  11/27 echocardiogram:Left ventricle: mildly dilated. -LVEF =40% to 45%. Diffuse hypokinesis.  11/25  started on  BiPAP, [facility]   Cultures 11/25 blood 2 negative 11/25 MRSA by PCR negative    12/1 fecal lactoferrin pending 12/1  gastrointestinal panel negative 12/1 C. difficile panel negative   Antimicrobials: Vanc started at OSH 11/17 >>stopped?? Levofloxacin 11/17 >> ?? (~11/20?) Cefepime 11/25 >> 12/1   Devices    LINES / TUBES:  Rt PICC placed at OSH 11/24 >>    Continuous Infusions:    Objective: Vitals:   11/09/16 1210 11/09/16 1216 11/09/16 1345 11/09/16 1420  BP: (!) 79/53 (!) 92/34    Pulse: 87 (!) 104  84  Resp: 17     Temp: 98.8 F (37.1 C)     TempSrc: Oral     SpO2: 97%  95%   Weight:      Height:        Intake/Output Summary (Last 24 hours) at 11/09/16 1600 Last data filed at 11/09/16 1324  Gross per 24 hour  Intake             1080 ml  Output             1925 ml  Net             -845 ml   Filed Weights   11/07/16 0431 11/08/16 0500 11/09/16 0622  Weight: 73.5 kg (162 lb 0.6 oz) 78 kg (172 lb) 75.3 kg (166 lb)    Examination:  General:A/O 4, chronically ill appearing, No acute respiratory distress HEENT: no JVD noted Lungs: Clear to auscultation bilaterally without wheezes or crackles Cardiovascular: Regular rate and rhythm without murmur gallop or rub normal S1 and S2 Abdomen: soft, NT, BS present, flexiseal noted Extremities: No significant cyanosis, clubbing, or edema bilateral lower extremities Skin: Negative rashes, lesions, ulcers Psychiatric:  Negative depression, negative anxiety, negative fatigue, negative mania  Central nervous system:  Cranial nerves II through XII intact, tongue/uvula midline, all extremities muscle strength 5/5, sensation intact throughout,negative dysarthria, negative expressive aphasia, negative receptive aphasia.  .  CBC:  Recent Labs Lab 11/05/16 0400 11/06/16 0545 11/07/16 0356 11/08/16 0412 11/09/16 0333  WBC 18.9* 12.4* 11.5* 10.6* 10.8*  HGB 10.4* 9.9* 10.4* 10.4* 10.4*  HCT 33.6* 31.7* 32.5* 32.7* 32.1*  MCV 95.5 96.1 93.9 93.7 93.3  PLT 387 321 322 313 284   Basic Metabolic Panel:  Recent Labs Lab  11/03/16 0721  11/04/16 0920 11/05/16 0400 11/06/16 0545 11/07/16 0356 11/08/16 0412 11/08/16 0945 11/09/16 0333  NA 141  --  141  --  140  --   --  138  --   K 4.4  --  5.0  --  4.0  --   --  4.2  --   CL 102  --  103  --  103  --   --  101  --   CO2 30  --  31  --  31  --   --  30  --   GLUCOSE 349*  --  383*  --  95  --   --  154*  --   BUN 70*  --  79*  --  49*  --   --  31*  --   CREATININE 2.30*  --  1.91*  --  1.37*  --   --  1.39*  --   CALCIUM 8.1*  --  8.1*  --  8.5*  --   --  8.6*  --   MG  --   < >  --  1.6* 1.5* 2.2 1.6*  --  1.4*  < > = values in this interval not displayed. GFR: Estimated Creatinine Clearance: 51.1 mL/min (by C-G formula based on SCr of 1.39 mg/dL (H)). Liver Function Tests:  Recent Labs Lab 11/04/16 0920 11/06/16 0545 11/08/16 0945  AST 40 124* 48*  ALT 23 88* 65*  ALKPHOS 108 102 133*  BILITOT 0.5 0.5 0.4  PROT 4.7* 4.2* 4.6*  ALBUMIN 2.1* 2.1* 2.2*   No results for input(s): LIPASE, AMYLASE in the last 168 hours. No results for input(s): AMMONIA in the last 168 hours. Coagulation Profile:  Recent Labs Lab 11/05/16 0400 11/06/16 0545 11/07/16 0356 11/08/16 0412 11/09/16 0333  INR 1.32 1.38 1.54 2.15 2.55   Cardiac Enzymes:  Recent Labs Lab 11/02/16 2041 11/03/16 0121 11/03/16 0721  TROPONINI 0.11* 0.14* 0.13*   BNP (last 3 results) No results for input(s): PROBNP in the last 8760 hours. HbA1C: No results for input(s): HGBA1C in the last 72 hours. CBG:  Recent Labs Lab 11/08/16 1152 11/08/16 1654 11/08/16 2048 11/09/16 0553 11/09/16 1209  GLUCAP 151* 136* 133* 119* 182*   Lipid Profile: No results for input(s): CHOL, HDL, LDLCALC, TRIG, CHOLHDL, LDLDIRECT in the last 72 hours. Thyroid Function Tests: No results for input(s): TSH, T4TOTAL, FREET4, T3FREE, THYROIDAB in the last 72 hours. Anemia Panel: No results for input(s): VITAMINB12, FOLATE, FERRITIN, TIBC, IRON, RETICCTPCT in the last 72 hours. Urine  analysis:    Component Value Date/Time   COLORURINE YELLOW 10/27/2016 1558   APPEARANCEUR CLOUDY (A) 10/27/2016 1558   LABSPEC 1.015 10/27/2016 1558   PHURINE 5.0 10/27/2016 1558   GLUCOSEU NEGATIVE 10/27/2016 1558   HGBUR NEGATIVE 10/27/2016 1558   BILIRUBINUR NEGATIVE 10/27/2016 1558   KETONESUR NEGATIVE 10/27/2016 1558   PROTEINUR NEGATIVE 10/27/2016 1558   NITRITE NEGATIVE 10/27/2016 1558   LEUKOCYTESUR NEGATIVE 10/27/2016 1558   Sepsis Labs: @LABRCNTIP (procalcitonin:4,lacticidven:4)  ) Recent Results (from the past 240 hour(s))  Gastrointestinal Panel by PCR , Stool     Status: None   Collection Time: 11/02/16  5:10 PM  Result Value Ref Range Status   Campylobacter species NOT DETECTED NOT DETECTED Final   Plesimonas shigelloides NOT DETECTED NOT DETECTED Final   Salmonella species NOT DETECTED NOT DETECTED Final   Yersinia enterocolitica NOT DETECTED NOT DETECTED Final   Vibrio species NOT DETECTED NOT DETECTED Final   Vibrio cholerae NOT DETECTED NOT DETECTED Final   Enteroaggregative E coli (EAEC) NOT DETECTED NOT DETECTED Final   Enteropathogenic E coli (EPEC) NOT DETECTED NOT DETECTED Final   Enterotoxigenic E coli (ETEC) NOT DETECTED NOT DETECTED Final   Shiga like toxin producing E coli (STEC) NOT DETECTED NOT DETECTED Final   Shigella/Enteroinvasive E coli (EIEC) NOT DETECTED NOT DETECTED Final   Cryptosporidium NOT DETECTED NOT DETECTED Final   Cyclospora cayetanensis NOT DETECTED NOT DETECTED Final   Entamoeba histolytica NOT DETECTED NOT DETECTED Final   Giardia lamblia NOT DETECTED NOT DETECTED Final   Adenovirus F40/41 NOT DETECTED NOT DETECTED Final   Astrovirus NOT DETECTED NOT DETECTED Final   Norovirus GI/GII NOT DETECTED NOT DETECTED Final   Rotavirus A NOT DETECTED NOT DETECTED Final   Sapovirus (I, II, IV, and V) NOT DETECTED NOT DETECTED Final  C difficile quick scan w PCR reflex     Status: None   Collection Time: 11/02/16  5:10 PM  Result Value  Ref Range Status   C Diff antigen NEGATIVE NEGATIVE Final   C Diff toxin NEGATIVE NEGATIVE  Final   C Diff interpretation No C. difficile detected.  Final         Radiology Studies: Dg Chest 2 View  Result Date: 11/09/2016 CLINICAL DATA:  Pneumonia. EXAM: CHEST  2 VIEW COMPARISON:  11/01/2016 FINDINGS: Patient's left-sided PICC line, tip overlying the level of the superior vena cava. A right-sided PICC line has been removed. Feeding tube has been removed. There are bilateral pleural effusions. Persistent opacity at the right lung base. There is increased opacity at the medial left lung base. Heart size is normal. No pulmonary edema. IMPRESSION: Increased opacity at the left lung base consistent with atelectasis or infiltrate. Persistent bilateral pleural effusions and right basilar opacity consistent with infiltrate. Electronically Signed   By: Norva Pavlov M.D.   On: 11/09/2016 15:14        Scheduled Meds: . amiodarone  200 mg Oral Daily  . aspirin  81 mg Per Tube QPM  . atorvastatin  80 mg Oral QHS  . budesonide (PULMICORT) nebulizer solution  0.5 mg Nebulization BID  . chlorhexidine  15 mL Mouth Rinse BID  . digoxin  0.125 mg Oral QPM  . feeding supplement (ENSURE ENLIVE)  237 mL Oral TID WC  . furosemide  20 mg Oral Daily  . Gerhardt's butt cream   Topical TID  . insulin aspart  0-20 Units Subcutaneous TID WC  . insulin aspart  0-5 Units Subcutaneous QHS  . ipratropium  0.5 mg Nebulization TID  . levalbuterol  0.63 mg Nebulization TID  . loperamide  2 mg Oral QID  . magnesium oxide  400 mg Oral Daily  . mouth rinse  15 mL Mouth Rinse q12n4p  . metoprolol tartrate  25 mg Oral BID  . warfarin  5 mg Oral ONCE-1800  . Warfarin - Pharmacist Dosing Inpatient   Does not apply q1800  . white petrolatum   Topical BID   Continuous Infusions:    LOS: 13 days    Time spent: 20 minutes  Pleas Koch, MD [facility] ([phone]   If 7PM-7AM, please contact  night-coverage [email or web address] Password [email or web address] 11/09/2016, 4:00 PM

## 2016-11-09 NOTE — Progress Notes (Signed)
ANTICOAGULATION CONSULT NOTE - Follow Up Consult  Pharmacy Consult for warfarin Indication: atrial fibrillation  Allergies  Allergen Reactions  . Penicillins Rash    No other information available at this time    Patient Measurements: Height: 5' 7.5" (171.5 cm) Weight: 166 lb (75.3 kg) IBW/kg (Calculated) : 67.25 Heparin Dosing Weight: 83.6 kg  Vital Signs: Temp: 98 F (36.7 C) (12/08 0622) Temp Source: Oral (12/08 0622) BP: 102/57 (12/08 0622) Pulse Rate: 107 (12/08 0622)   Assessment: 65 yoM on Coumadin PTA for AFib (home dose = 7.5mg  daily). Initially bridged with heparin drip while INR subtherapeutic, but discontinued 12/7 once INR > 2.0. INR today remains therapeutic at 2.55 but up significantly from 2.15 yesterday. CBC stable, no S/Sx bleeding noted.  Goal of Therapy:  INR 2.0-3.0 Monitor platelets by anticoagulation protocol: Yes   Plan:  -Warfarin 5mg  PO x1 tonight -Monitor daily INR, S/Sx bleeding  Fredonia HighlandMichael Adeja Sarratt, PharmD PGY-1 Pharmacy Resident Pager: [phone] 11/09/2016

## 2016-11-09 NOTE — Progress Notes (Signed)
Spoke to J. Samtani, MD regarding BP, no new orders at this time. MD is coming to see pt.

## 2016-11-09 NOTE — Progress Notes (Signed)
Applied new sacral dressing

## 2016-11-09 NOTE — Care Management Important Message (Signed)
Important Message  Patient Details  Name: Dylan ClinesDouglas E Crespi MRN: [id number] Date of Birth: 1950-12-30   Medicare Important Message Given:  Yes    Dorena BodoIris Deborha Moseley 11/09/2016, 12:19 PM

## 2016-11-09 NOTE — Care Management Important Message (Signed)
Important Message  Patient Details  Name: Dylan Hubbard MRN: [id number] Date of Birth: 01/02/1951   Medicare Important Message Given:  Yes    Raza Bayless 11/09/2016, 12:19 PM 

## 2016-11-10 LAB — CBC
HCT: 32.2 % — ABNORMAL LOW (ref 39.0–52.0)
Hemoglobin: 10.3 g/dL — ABNORMAL LOW (ref 13.0–17.0)
MCH: 29.9 pg (ref 26.0–34.0)
MCHC: 32 g/dL (ref 30.0–36.0)
MCV: 93.6 fL (ref 78.0–100.0)
Platelets: 261 10*3/uL (ref 150–400)
RBC: 3.44 MIL/uL — ABNORMAL LOW (ref 4.22–5.81)
RDW: 15.5 % (ref 11.5–15.5)
WBC: 11 10*3/uL — ABNORMAL HIGH (ref 4.0–10.5)

## 2016-11-10 LAB — GLUCOSE, CAPILLARY
Glucose-Capillary: 113 mg/dL — ABNORMAL HIGH (ref 65–99)
Glucose-Capillary: 122 mg/dL — ABNORMAL HIGH (ref 65–99)
Glucose-Capillary: 100 mg/dL — ABNORMAL HIGH (ref 65–99)
Glucose-Capillary: 97 mg/dL (ref 65–99)

## 2016-11-10 LAB — PROTIME-INR
INR: 2.84
Prothrombin Time: 30.4 seconds — ABNORMAL HIGH (ref 11.4–15.2)

## 2016-11-10 LAB — BASIC METABOLIC PANEL WITH GFR
Anion gap: 8 (ref 5–15)
BUN: 19 mg/dL (ref 6–20)
CO2: 32 mmol/L (ref 22–32)
Calcium: 8.8 mg/dL — ABNORMAL LOW (ref 8.9–10.3)
Chloride: 102 mmol/L (ref 101–111)
Creatinine, Ser: 1.42 mg/dL — ABNORMAL HIGH (ref 0.61–1.24)
GFR calc Af Amer: 59 mL/min — ABNORMAL LOW
GFR calc non Af Amer: 51 mL/min — ABNORMAL LOW
Glucose, Bld: 126 mg/dL — ABNORMAL HIGH (ref 65–99)
Potassium: 4.3 mmol/L (ref 3.5–5.1)
Sodium: 142 mmol/L (ref 135–145)

## 2016-11-10 LAB — MAGNESIUM: MAGNESIUM: 1.5 mg/dL — AB (ref 1.7–2.4)

## 2016-11-10 MED ORDER — MAGNESIUM OXIDE 400 (241.3 MG) MG PO TABS
400.0000 mg | ORAL_TABLET | Freq: Two times a day (BID) | ORAL | Status: DC
  Administered 2016-11-10 – 2016-11-13 (×6): 400 mg via ORAL
  Filled 2016-11-10 (×6): qty 1

## 2016-11-10 MED ORDER — ALPRAZOLAM 0.25 MG PO TABS
0.2500 mg | ORAL_TABLET | Freq: Once | ORAL | Status: AC
  Administered 2016-11-10: 0.25 mg via ORAL
  Filled 2016-11-10: qty 1

## 2016-11-10 MED ORDER — WARFARIN SODIUM 5 MG PO TABS
5.0000 mg | ORAL_TABLET | Freq: Once | ORAL | Status: AC
  Administered 2016-11-10: 5 mg via ORAL
  Filled 2016-11-10: qty 1

## 2016-11-10 NOTE — Progress Notes (Signed)
PROGRESS NOTE    Dylan Hubbard  ZOX:[id number] DOB: [birth year] DOA: 10/27/2016 PCP: Crist FatVAN EYK, JASON, MD   Brief Narrative:  65 y/o ?  Chronic Systolic CHF (EF 81-19%25-30%),  Atrial Fibrillation with RVR on anticoagulation,  HTN,  HLD,  Chronic Respiratory disease  (chart carries dx of COPD) with Chronic Hypoxic and Hypercapnic respiratory failure,  OSA.  DM type II uncontrolled with complication. [facility] on [facility]6 with progressively worsening dyspnea. He apparently had stopped his medications (which include lasix). He also had AF with RVR, with an attempt at cardioversion on 11/10 which was unsuccessful. He was rate (but not rhythm) controlled with amio and digoxin, both of which appeared to be new medications for him.  fever on 11/17, and CXR demonstrated RLL consolidation. He was initially treated with levofloxacin and vanc, but when his blood culture grew staph epi, it appears that he was narrowed to vanc only. He also had a steadily increasing creatinine from baseline <1 to ~3.0 on presentation to [facility]. [facility], and a PICC line was placed on the day of discharge. His wife spoke with him after the procedure, and said he was "out of head"  because of the sedation he received for the procedure.  On the morning of 11/25, he was found to be poorly responsive and EMS was called.  Rather than [facility], [facility]. He found to be in acute on chronic hypercarbic respiratory failure. He was started on BiPAP, and after a few hours, he did have some improvement in his mental status and blood gas.  Since admission treated for COPD exacerbation/HCAP, now with profuse diarrhea [facility] to Floor today 12/6  Subjective:  Overall feels a little, requesting his antianxiety medication Breathing is better and does not feel as marginal as he did yesterday Blood sugars have come up   Assessment &  Plan:  Acute on chronic hypercarbic / hypoxic respiratory failure -due to COPD/CHF, improved, see below -continue Pulmicort 0.5 bid, Atrovent 0.5 tid and Xopenex tid   Metabolic Encephalopathy -Multifactorial to include Hypercarbia, COPD, HCAP -Resolving -SNF recommended by PT  COPD exacerbation/HCAP -Completed 7 day course antibiotics, stopped 12/1 -Pulmicort nebulizer BID -continue Atrovent/Xopenex -CXr done earlier 12/8 not suspicious for PNA-shows RLL but recently Rx for PNA.  monitor  Chronic Systolic CHF/Cardiorenal syndrome  -EF 40-45%, currently on Metoprolol 12.5 mg BID -resumed on 12/8 low dose Digoxin 0.125 -holding PO lasix due to diarrhea, resumed 12/7  Atrial Fibrillation with RVR -Chad2svasc schore >3 - rate controlled -We have held metoprolol 12.5 bid 12/9 because of low blood sugars but continued amiodarone 200 qd -on warfarin  per pharmacy, INR 2.8 today  Demand ischemia -Most likely combination of hypoxic respiratory failure, A. fib with RVR, renal failure.  -ECHO with diffuse hypokinesis, EF improved from prior known EF of 25-30%  Acute renal failure? Unknown baseline -Improving -due to cardiorenal syndrome, recent Vancomycin for contaminant staph epi at OSH -rpt labs in am  DM Type 2 Controlled With complication -Hba1c6.7 -SSI, stable now, sugars in the low 100 range  Positive culture (Staph Epi) in blood - 1/2 cultures, likely contaminant  -At outside hospital, however blood culture here negative  Diarrhea -ABx associated likely -C. difficile/GI panel negative -non infectious, increase imodium to QID -Flexi-seal was removed on 11/09/2016 and has had no diarrhea  DVT prophylaxis: Heparin per pharmacy--> Coumadin Code Status: Partial Family Communication: None  Disposition Plan: SNF soon   Consultants:  PCCM  Procedures/Significant Events:  11/27 echocardiogram:Left ventricle: mildly dilated. -LVEF =40% to 45%. Diffuse hypokinesis.   11/25  started on BiPAP, tx to [facility]   Cultures 11/25 blood 2 negative 11/25 MRSA by PCR negative    12/1 fecal lactoferrin pending 12/1 gastrointestinal panel negative 12/1 C. difficile panel negative   Antimicrobials: Vanc started at OSH 11/17 >>stopped?? Levofloxacin 11/17 >> ?? (~11/20?) Cefepime 11/25 >> 12/1   Devices    LINES / TUBES:  Rt PICC placed at OSH 11/24 >>    Continuous Infusions:    Objective: Vitals:   11/09/16 1827 11/09/16 1939 11/10/16 0649 11/10/16 0903  BP:  (!) 86/61 (!) 89/52 100/65  Pulse:  65 68 70  Resp:  18 18   Temp:  98.2 F (36.8 C) 97.9 F (36.6 C) 98 F (36.7 C)  TempSrc:  Oral Oral Oral  SpO2: 96% 98% 97% 97%  Weight:   78 kg (172 lb)   Height:        Intake/Output Summary (Last 24 hours) at 11/10/16 0944 Last data filed at 11/10/16 0600  Gross per 24 hour  Intake              660 ml  Output             1300 ml  Net             -640 ml   Filed Weights   11/08/16 0500 11/09/16 0622 11/10/16 0649  Weight: 78 kg (172 lb) 75.3 kg (166 lb) 78 kg (172 lb)    Examination:  General:A/O 4, chronically ill appearing, No acute respiratory distress, just had neb HEENT: no JVD noted Lungs: Clear to auscultation bilaterally without wheezes or crackles Cardiovascular: Regular rate and rhythm without murmur gallop or rub normal S1 and S2 Abdomen: soft, NT, BS present, flexiseal gone Extremities: No significant cyanosis, clubbing, or edema bilateral lower extremities Skin: Negative rashes, lesions, ulcers Psychiatric:  Negative depression, negative anxiety, negative fatigue, negative mania  Central nervous system:  Cranial nerves II through XII intact, tongue/uvula midline, all extremities muscle strength 5/5  .  CBC:  Recent Labs Lab 11/06/16 0545 11/07/16 0356 11/08/16 0412 11/09/16 0333 11/10/16 0355  WBC 12.4* 11.5* 10.6* 10.8* 11.0*  HGB 9.9* 10.4* 10.4* 10.4* 10.3*  HCT 31.7* 32.5* 32.7* 32.1* 32.2*  MCV 96.1  93.9 93.7 93.3 93.6  PLT 321 322 313 284 261   Basic Metabolic Panel:  Recent Labs Lab 11/04/16 0920  11/06/16 0545 11/07/16 0356 11/08/16 0412 11/08/16 0945 11/09/16 0333 11/10/16 0355  NA 141  --  140  --   --  138  --   --   K 5.0  --  4.0  --   --  4.2  --   --   CL 103  --  103  --   --  101  --   --   CO2 31  --  31  --   --  30  --   --   GLUCOSE 383*  --  95  --   --  154*  --   --   BUN 79*  --  49*  --   --  31*  --   --   CREATININE 1.91*  --  1.37*  --   --  1.39*  --   --   CALCIUM 8.1*  --  8.5*  --   --  8.6*  --   --   MG  --   < > 1.5* 2.2 1.6*  --  1.4* 1.5*  < > = values in this interval not displayed. GFR: Estimated Creatinine Clearance: 51.1 mL/min (by C-G formula based on SCr of 1.39 mg/dL (H)). Liver Function Tests:  Recent Labs Lab 11/04/16 0920 11/06/16 0545 11/08/16 0945  AST 40 124* 48*  ALT 23 88* 65*  ALKPHOS 108 102 133*  BILITOT 0.5 0.5 0.4  PROT 4.7* 4.2* 4.6*  ALBUMIN 2.1* 2.1* 2.2*   No results for input(s): LIPASE, AMYLASE in the last 168 hours. No results for input(s): AMMONIA in the last 168 hours. Coagulation Profile:  Recent Labs Lab 11/06/16 0545 11/07/16 0356 11/08/16 0412 11/09/16 0333 11/10/16 0355  INR 1.38 1.54 2.15 2.55 2.84   Cardiac Enzymes: No results for input(s): CKTOTAL, CKMB, CKMBINDEX, TROPONINI in the last 168 hours. BNP (last 3 results) No results for input(s): PROBNP in the last 8760 hours. HbA1C: No results for input(s): HGBA1C in the last 72 hours. CBG:  Recent Labs Lab 11/09/16 0553 11/09/16 1209 11/09/16 1607 11/09/16 2150 11/10/16 0630  GLUCAP 119* 182* 153* 106* 113*   Lipid Profile: No results for input(s): CHOL, HDL, LDLCALC, TRIG, CHOLHDL, LDLDIRECT in the last 72 hours. Thyroid Function Tests: No results for input(s): TSH, T4TOTAL, FREET4, T3FREE, THYROIDAB in the last 72 hours. Anemia Panel: No results for input(s): VITAMINB12, FOLATE, FERRITIN, TIBC, IRON, RETICCTPCT in the  last 72 hours. Urine analysis:    Component Value Date/Time   COLORURINE YELLOW 10/27/2016 1558   APPEARANCEUR CLOUDY (A) 10/27/2016 1558   LABSPEC 1.015 10/27/2016 1558   PHURINE 5.0 10/27/2016 1558   GLUCOSEU NEGATIVE 10/27/2016 1558   HGBUR NEGATIVE 10/27/2016 1558   BILIRUBINUR NEGATIVE 10/27/2016 1558   KETONESUR NEGATIVE 10/27/2016 1558   PROTEINUR NEGATIVE 10/27/2016 1558   NITRITE NEGATIVE 10/27/2016 1558   LEUKOCYTESUR NEGATIVE 10/27/2016 1558   Sepsis Labs: @LABRCNTIP (procalcitonin:4,lacticidven:4)  ) Recent Results (from the past 240 hour(s))  Gastrointestinal Panel by PCR , Stool     Status: None   Collection Time: 11/02/16  5:10 PM  Result Value Ref Range Status   Campylobacter species NOT DETECTED NOT DETECTED Final   Plesimonas shigelloides NOT DETECTED NOT DETECTED Final   Salmonella species NOT DETECTED NOT DETECTED Final   Yersinia enterocolitica NOT DETECTED NOT DETECTED Final   Vibrio species NOT DETECTED NOT DETECTED Final   Vibrio cholerae NOT DETECTED NOT DETECTED Final   Enteroaggregative E coli (EAEC) NOT DETECTED NOT DETECTED Final   Enteropathogenic E coli (EPEC) NOT DETECTED NOT DETECTED Final   Enterotoxigenic E coli (ETEC) NOT DETECTED NOT DETECTED Final   Shiga like toxin producing E coli (STEC) NOT DETECTED NOT DETECTED Final   Shigella/Enteroinvasive E coli (EIEC) NOT DETECTED NOT DETECTED Final   Cryptosporidium NOT DETECTED NOT DETECTED Final   Cyclospora cayetanensis NOT DETECTED NOT DETECTED Final   Entamoeba histolytica NOT DETECTED NOT DETECTED Final   Giardia lamblia NOT DETECTED NOT DETECTED Final   Adenovirus F40/41 NOT DETECTED NOT DETECTED Final   Astrovirus NOT DETECTED NOT DETECTED Final   Norovirus GI/GII NOT DETECTED NOT DETECTED Final   Rotavirus A NOT DETECTED NOT DETECTED Final   Sapovirus (I, II, IV, and V) NOT DETECTED NOT DETECTED Final  C difficile quick scan w PCR reflex     Status: None   Collection Time: 11/02/16   5:10 PM  Result Value Ref Range Status   C Diff  antigen NEGATIVE NEGATIVE Final   C Diff toxin NEGATIVE NEGATIVE Final   C Diff interpretation No C. difficile detected.  Final         Radiology Studies: Dg Chest 2 View  Result Date: 11/09/2016 CLINICAL DATA:  Pneumonia. EXAM: CHEST  2 VIEW COMPARISON:  11/01/2016 FINDINGS: Patient's left-sided PICC line, tip overlying the level of the superior vena cava. A right-sided PICC line has been removed. Feeding tube has been removed. There are bilateral pleural effusions. Persistent opacity at the right lung base. There is increased opacity at the medial left lung base. Heart size is normal. No pulmonary edema. IMPRESSION: Increased opacity at the left lung base consistent with atelectasis or infiltrate. Persistent bilateral pleural effusions and right basilar opacity consistent with infiltrate. Electronically Signed   By: Norva PavlovElizabeth  Brown M.D.   On: 11/09/2016 15:14        Scheduled Meds: . ALPRAZolam  0.25 mg Oral Once  . amiodarone  200 mg Oral Daily  . aspirin  81 mg Per Tube QPM  . atorvastatin  80 mg Oral QHS  . budesonide (PULMICORT) nebulizer solution  0.5 mg Nebulization BID  . chlorhexidine  15 mL Mouth Rinse BID  . digoxin  0.125 mg Oral QPM  . feeding supplement (ENSURE ENLIVE)  237 mL Oral TID WC  . furosemide  20 mg Oral Daily  . Gerhardt's butt cream   Topical TID  . insulin aspart  0-20 Units Subcutaneous TID WC  . insulin aspart  0-5 Units Subcutaneous QHS  . ipratropium  0.5 mg Nebulization TID  . levalbuterol  0.63 mg Nebulization TID  . loperamide  2 mg Oral QID  . magnesium oxide  400 mg Oral Daily  . mouth rinse  15 mL Mouth Rinse q12n4p  . warfarin  5 mg Oral ONCE-1800  . Warfarin - Pharmacist Dosing Inpatient   Does not apply q1800  . white petrolatum   Topical BID   Continuous Infusions:    LOS: 14 days    Time spent: 15 minutes  Pleas KochJai Jamy Whyte, MD [facility](P) [phone]   If 7PM-7AM,  please contact night-coverage [email or web address] Password [email or web address] 11/10/2016, 9:44 AM

## 2016-11-10 NOTE — Progress Notes (Signed)
ANTICOAGULATION CONSULT NOTE - Follow Up Consult  Pharmacy Consult for warfarin Indication: atrial fibrillation  Allergies  Allergen Reactions  . Penicillins Rash    No other information available at this time    Patient Measurements: Height: 5' 7.5" (171.5 cm) Weight: 172 lb (78 kg) IBW/kg (Calculated) : 67.25 Heparin Dosing Weight: 83.6 kg  Vital Signs: Temp: 98.6 F (37 C) (12/09 1207) Temp Source: Oral (12/09 1207) BP: 79/43 (12/09 1207) Pulse Rate: 63 (12/09 1207)   Assessment: 65 yoM on Coumadin PTA for AFib (home dose = 7.5mg  daily). Initially bridged with heparin drip while INR subtherapeutic, but discontinued 12/7 once INR > 2.0. I  INR today - 2.84, dose not given 12/8  Goal of Therapy:  INR 2.0-3.0 Monitor platelets by anticoagulation protocol: Yes   Plan:  -Warfarin 5mg  PO x1 tonight -Monitor daily INR, S/Sx bleeding  Thank you Okey RegalLisa Rontrell Moquin, PharmD [phone] 11/10/2016

## 2016-11-11 LAB — COMPREHENSIVE METABOLIC PANEL
ALK PHOS: 150 U/L — AB (ref 38–126)
ALT: 38 U/L (ref 17–63)
AST: 27 U/L (ref 15–41)
Albumin: 2.2 g/dL — ABNORMAL LOW (ref 3.5–5.0)
Anion gap: 10 (ref 5–15)
BUN: 16 mg/dL (ref 6–20)
CALCIUM: 8.6 mg/dL — AB (ref 8.9–10.3)
CO2: 31 mmol/L (ref 22–32)
CREATININE: 1.49 mg/dL — AB (ref 0.61–1.24)
Chloride: 98 mmol/L — ABNORMAL LOW (ref 101–111)
GFR, EST AFRICAN AMERICAN: 55 mL/min — AB (ref >60)
GFR, EST NON AFRICAN AMERICAN: 48 mL/min — AB (ref >60)
Glucose, Bld: 86 mg/dL (ref 65–99)
Potassium: 4 mmol/L (ref 3.5–5.1)
SODIUM: 139 mmol/L (ref 135–145)
Total Bilirubin: 0.6 mg/dL (ref 0.3–1.2)
Total Protein: 4.6 g/dL — ABNORMAL LOW (ref 6.5–8.1)

## 2016-11-11 LAB — CBC
HCT: 32.4 % — ABNORMAL LOW (ref 39.0–52.0)
HEMOGLOBIN: 10.4 g/dL — AB (ref 13.0–17.0)
MCH: 30.1 pg (ref 26.0–34.0)
MCHC: 32.1 g/dL (ref 30.0–36.0)
MCV: 93.6 fL (ref 78.0–100.0)
Platelets: 249 10*3/uL (ref 150–400)
RBC: 3.46 MIL/uL — AB (ref 4.22–5.81)
RDW: 15.6 % — ABNORMAL HIGH (ref 11.5–15.5)
WBC: 10.5 10*3/uL (ref 4.0–10.5)

## 2016-11-11 LAB — PROTIME-INR
INR: 3.04
Prothrombin Time: 32.1 seconds — ABNORMAL HIGH (ref 11.4–15.2)

## 2016-11-11 LAB — GLUCOSE, CAPILLARY
GLUCOSE-CAPILLARY: 83 mg/dL (ref 65–99)
GLUCOSE-CAPILLARY: 96 mg/dL (ref 65–99)
GLUCOSE-CAPILLARY: 111 mg/dL — AB (ref 65–99)
Glucose-Capillary: 91 mg/dL (ref 65–99)

## 2016-11-11 LAB — MAGNESIUM: Magnesium: 1.3 mg/dL — ABNORMAL LOW (ref 1.7–2.4)

## 2016-11-11 MED ORDER — WARFARIN SODIUM 2 MG PO TABS
1.0000 mg | ORAL_TABLET | Freq: Once | ORAL | Status: AC
  Administered 2016-11-11: 1 mg via ORAL
  Filled 2016-11-11: qty 0.5

## 2016-11-11 NOTE — Progress Notes (Signed)
ANTICOAGULATION CONSULT NOTE - Follow Up Consult  Pharmacy Consult for warfarin Indication: atrial fibrillation  Allergies  Allergen Reactions  . Penicillins Rash    No other information available at this time    Patient Measurements: Height: 5' 7.5" (171.5 cm) Weight: 150 lb (68 kg) IBW/kg (Calculated) : 67.25 Heparin Dosing Weight: 83.6 kg  Vital Signs: Temp: 98.2 F (36.8 C) (12/10 0357) Temp Source: Oral (12/10 0357) BP: 98/52 (12/10 0357) Pulse Rate: 63 (12/10 0357)   Assessment: 65 yoM on Coumadin PTA for AFib (home dose = 7.5mg  daily).  Initially bridged with heparin drip while INR subtherapeutic, but discontinued 12/7 once INR > 2.0.   INR today - 3.04, dose not given 12/8, INR trending up from 10 mg doses given earlier  Goal of Therapy:  INR 2-3 Monitor platelets by anticoagulation protocol: Yes   Plan:  -Warfarin 1mg  PO x1 tonight -Monitor daily INR, S/Sx bleeding  Thank you Okey RegalLisa Ajanee Buren, PharmD [phone] 11/11/2016

## 2016-11-11 NOTE — Progress Notes (Signed)
PROGRESS NOTE    Dylan Hubbard  ZOX:[id number] DOB: [birth year] DOA: 10/27/2016 PCP: Crist Fat, MD   Brief Narrative:  65 y/o ?  Chronic Systolic CHF (EF 81-19%),  Atrial Fibrillation with RVR on anticoagulation,  HTN,  HLD,  Chronic Respiratory disease  (chart carries dx of COPD) with Chronic Hypoxic and Hypercapnic respiratory failure,  OSA.  DM type II uncontrolled with complication. Presented to [facility] on 11/6 with progressively worsening dyspnea. He apparently had stopped his medications (which include lasix). He also had AF with RVR, with an attempt at cardioversion on 11/10 which was unsuccessful. He was rate (but not rhythm) controlled with amio and digoxin, both of which appeared to be new medications for him.  fever on 11/17, and CXR demonstrated RLL consolidation. He was initially treated with levofloxacin and vanc, but when his blood culture grew staph epi, it appears that he was narrowed to vanc only. He also had a steadily increasing creatinine from baseline <1 to ~3.0 on presentation to [facility]. He was discharged from [place] on 11/24, and a PICC line was placed on the day of discharge. His wife spoke with him after the procedure, and said he was "out of head"  because of the sedation he received for the procedure.  On the morning of 11/25, he was found to be poorly responsive and EMS was called.  Rather than re-presenting to [facility], EMS brought him to [facility]. He found to be in acute on chronic hypercarbic respiratory failure. He was started on BiPAP, and after a few hours, he did have some improvement in his mental status and blood gas.  Since admission treated for COPD exacerbation/HCAP, now with profuse diarrhea Transferred from [facility] Team 1 to Floor today 12/6  Subjective:  Fair  No new issues No diarr No fever Still somewhat weak Hasn't been oob tol diet No cp  Assessment & Plan:  Acute on chronic hypercarbic / hypoxic respiratory  failure -due to COPD/CHF, improved, see below -continue Pulmicort 0.5 bid, Atrovent 0.5 tid and Xopenex tid   Metabolic Encephalopathy -Multifactorial to include Hypercarbia, COPD, HCAP -Resolving -SNF recommended by PT  COPD exacerbation/HCAP -Completed 7 day course antibiotics, stopped 12/1 -Pulmicort nebulizer BID -continue Atrovent/Xopenex -CXr done earlier 12/8 not suspicious for PNA-shows RLL but recently Rx for PNA.  monitor  Chronic Systolic CHF/Cardiorenal syndrome  -EF 40-45%, currently on Metoprolol 12.5 mg BID -resumed on 12/8 low dose Digoxin 0.125 -holding PO lasix due to diarrhea, resumed 12/7 -labs show stable creat  Atrial Fibrillation with RVR -Chad2svasc schore >3 - rate controlled -We have held metoprolol 12.5 bid 12/9 because of low blood sugars but continued amiodarone 200 qd -on warfarin  per pharmacy, INR 2.8 today -replacing Mag  Demand ischemia -Most likely combination of hypoxic respiratory failure, A. fib with RVR, renal failure.  -ECHO with diffuse hypokinesis, EF improved from prior known EF of 25-30%  Acute renal failure? Unknown baseline -Improving -due to cardiorenal syndrome, recent Vancomycin for contaminant staph epi at OSH  DM Type 2 Controlled With complication -Hba1c6.7 -SSI, stable now, sugars in the low 100 range  Positive culture (Staph Epi) in blood - 1/2 cultures, likely contaminant  -At outside hospital, however blood culture here negative  Diarrhea -ABx associated likely -C. difficile/GI panel negative -non infectious, increase imodium to QID -Flexi-seal was removed on 11/09/2016 and has had no diarrhea  DVT prophylaxis: Heparin per pharmacy--> Coumadin Code Status: Partial Family Communication: None Disposition Plan: SNF probably 11/12/16  Consultants:  PCCM  Procedures/Significant Events:  11/27 echocardiogram:Left ventricle: mildly dilated. -LVEF =40% to 45%. Diffuse hypokinesis.  11/25  started on BiPAP,  [facility]   Cultures 11/25 blood 2 negative 11/25 MRSA by PCR negative    12/1 fecal lactoferrin pending 12/1 gastrointestinal panel negative 12/1 C. difficile panel negative   Antimicrobials: Vanc started at OSH 11/17 >>stopped?? Levofloxacin 11/17 >> ?? (~11/20?) Cefepime 11/25 >> 12/1   Devices    LINES / TUBES:  Rt PICC placed at OSH 11/24 >>    Continuous Infusions:    Objective: Vitals:   11/10/16 1923 11/10/16 1952 11/11/16 0357 11/11/16 0714  BP:  (!) 120/56 (!) 98/52   Pulse:  60 63   Resp:  20 (!) 21   Temp:  98.2 F (36.8 C) 98.2 F (36.8 C)   TempSrc:  Oral Oral   SpO2: 99% 99% 95% 98%  Weight:   68 kg (150 lb)   Height:        Intake/Output Summary (Last 24 hours) at 11/11/16 1047 Last data filed at 11/11/16 1005  Gross per 24 hour  Intake              300 ml  Output              725 ml  Net             -425 ml   Filed Weights   11/09/16 0622 11/10/16 0649 11/11/16 0357  Weight: 75.3 kg (166 lb) 78 kg (172 lb) 68 kg (150 lb)    Examination:  General:A/O 4, chronically ill appearing, No acute respiratory distress, just had neb HEENT: no JVD noted Lungs: Clear to auscultation bilaterally without wheezes or crackles Cardiovascular: Regular rate and rhythm without murmur gallop or rub normal S1 and S2 Abdomen: soft, NT, BS present, flexiseal gone Extremities: No significant cyanosis, clubbing, or edema bilateral lower extremities Skin: Negative rashes, lesions, ulcers Psychiatric:  Negative depression, negative anxiety, negative fatigue, negative mania  Central nervous system:  Cranial nerves II through XII intact, tongue/uvula midline, all extremities muscle strength 5/5  .  CBC:  Recent Labs Lab 11/07/16 0356 11/08/16 0412 11/09/16 0333 11/10/16 0355 11/11/16 0400  WBC 11.5* 10.6* 10.8* 11.0* 10.5  HGB 10.4* 10.4* 10.4* 10.3* 10.4*  HCT 32.5* 32.7* 32.1* 32.2* 32.4*  MCV 93.9 93.7 93.3 93.6 93.6  PLT 322 313 284 261 249    Basic Metabolic Panel:  Recent Labs Lab 11/06/16 0545 11/07/16 0356 11/08/16 0412 11/08/16 0945 11/09/16 0333 11/10/16 0355 11/10/16 0830 11/11/16 0400  NA 140  --   --  138  --   --  142 139  K 4.0  --   --  4.2  --   --  4.3 4.0  CL 103  --   --  101  --   --  102 98*  CO2 31  --   --  30  --   --  32 31  GLUCOSE 95  --   --  154*  --   --  126* 86  BUN 49*  --   --  31*  --   --  19 16  CREATININE 1.37*  --   --  1.39*  --   --  1.42* 1.49*  CALCIUM 8.5*  --   --  8.6*  --   --  8.8* 8.6*  MG 1.5* 2.2 1.6*  --  1.4* 1.5*  --  1.3*  GFR: Estimated Creatinine Clearance: 47.7 mL/min (by C-G formula based on SCr of 1.49 mg/dL (H)). Liver Function Tests:  Recent Labs Lab 11/06/16 0545 11/08/16 0945 11/11/16 0400  AST 124* 48* 27  ALT 88* 65* 38  ALKPHOS 102 133* 150*  BILITOT 0.5 0.4 0.6  PROT 4.2* 4.6* 4.6*  ALBUMIN 2.1* 2.2* 2.2*   No results for input(s): LIPASE, AMYLASE in the last 168 hours. No results for input(s): AMMONIA in the last 168 hours. Coagulation Profile:  Recent Labs Lab 11/07/16 0356 11/08/16 0412 11/09/16 0333 11/10/16 0355 11/11/16 0400  INR 1.54 2.15 2.55 2.84 3.04   Cardiac Enzymes: No results for input(s): CKTOTAL, CKMB, CKMBINDEX, TROPONINI in the last 168 hours. BNP (last 3 results) No results for input(s): PROBNP in the last 8760 hours. HbA1C: No results for input(s): HGBA1C in the last 72 hours. CBG:  Recent Labs Lab 11/10/16 0630 11/10/16 1115 11/10/16 1614 11/10/16 2050 11/11/16 0636  GLUCAP 113* 122* 100* 97 83   Lipid Profile: No results for input(s): CHOL, HDL, LDLCALC, TRIG, CHOLHDL, LDLDIRECT in the last 72 hours. Thyroid Function Tests: No results for input(s): TSH, T4TOTAL, FREET4, T3FREE, THYROIDAB in the last 72 hours. Anemia Panel: No results for input(s): VITAMINB12, FOLATE, FERRITIN, TIBC, IRON, RETICCTPCT in the last 72 hours. Urine analysis:    Component Value Date/Time   COLORURINE YELLOW  10/27/2016 1558   APPEARANCEUR CLOUDY (A) 10/27/2016 1558   LABSPEC 1.015 10/27/2016 1558   PHURINE 5.0 10/27/2016 1558   GLUCOSEU NEGATIVE 10/27/2016 1558   HGBUR NEGATIVE 10/27/2016 1558   BILIRUBINUR NEGATIVE 10/27/2016 1558   KETONESUR NEGATIVE 10/27/2016 1558   PROTEINUR NEGATIVE 10/27/2016 1558   NITRITE NEGATIVE 10/27/2016 1558   LEUKOCYTESUR NEGATIVE 10/27/2016 1558   Sepsis Labs: @LABRCNTIP (procalcitonin:4,lacticidven:4)  ) Recent Results (from the past 240 hour(s))  Gastrointestinal Panel by PCR , Stool     Status: None   Collection Time: 11/02/16  5:10 PM  Result Value Ref Range Status   Campylobacter species NOT DETECTED NOT DETECTED Final   Plesimonas shigelloides NOT DETECTED NOT DETECTED Final   Salmonella species NOT DETECTED NOT DETECTED Final   Yersinia enterocolitica NOT DETECTED NOT DETECTED Final   Vibrio species NOT DETECTED NOT DETECTED Final   Vibrio cholerae NOT DETECTED NOT DETECTED Final   Enteroaggregative E coli (EAEC) NOT DETECTED NOT DETECTED Final   Enteropathogenic E coli (EPEC) NOT DETECTED NOT DETECTED Final   Enterotoxigenic E coli (ETEC) NOT DETECTED NOT DETECTED Final   Shiga like toxin producing E coli (STEC) NOT DETECTED NOT DETECTED Final   Shigella/Enteroinvasive E coli (EIEC) NOT DETECTED NOT DETECTED Final   Cryptosporidium NOT DETECTED NOT DETECTED Final   Cyclospora cayetanensis NOT DETECTED NOT DETECTED Final   Entamoeba histolytica NOT DETECTED NOT DETECTED Final   Giardia lamblia NOT DETECTED NOT DETECTED Final   Adenovirus F40/41 NOT DETECTED NOT DETECTED Final   Astrovirus NOT DETECTED NOT DETECTED Final   Norovirus GI/GII NOT DETECTED NOT DETECTED Final   Rotavirus A NOT DETECTED NOT DETECTED Final   Sapovirus (I, II, IV, and V) NOT DETECTED NOT DETECTED Final  C difficile quick scan w PCR reflex     Status: None   Collection Time: 11/02/16  5:10 PM  Result Value Ref Range Status   C Diff antigen NEGATIVE NEGATIVE Final    C Diff toxin NEGATIVE NEGATIVE Final   C Diff interpretation No C. difficile detected.  Final         Radiology Studies:  Dg Chest 2 View  Result Date: 11/09/2016 CLINICAL DATA:  Pneumonia. EXAM: CHEST  2 VIEW COMPARISON:  11/01/2016 FINDINGS: Patient's left-sided PICC line, tip overlying the level of the superior vena cava. A right-sided PICC line has been removed. Feeding tube has been removed. There are bilateral pleural effusions. Persistent opacity at the right lung base. There is increased opacity at the medial left lung base. Heart size is normal. No pulmonary edema. IMPRESSION: Increased opacity at the left lung base consistent with atelectasis or infiltrate. Persistent bilateral pleural effusions and right basilar opacity consistent with infiltrate. Electronically Signed   By: Norva PavlovElizabeth  Brown M.D.   On: 11/09/2016 15:14        Scheduled Meds: . amiodarone  200 mg Oral Daily  . aspirin  81 mg Per Tube QPM  . atorvastatin  80 mg Oral QHS  . budesonide (PULMICORT) nebulizer solution  0.5 mg Nebulization BID  . chlorhexidine  15 mL Mouth Rinse BID  . digoxin  0.125 mg Oral QPM  . feeding supplement (ENSURE ENLIVE)  237 mL Oral TID WC  . furosemide  20 mg Oral Daily  . Gerhardt's butt cream   Topical TID  . insulin aspart  0-20 Units Subcutaneous TID WC  . insulin aspart  0-5 Units Subcutaneous QHS  . ipratropium  0.5 mg Nebulization TID  . levalbuterol  0.63 mg Nebulization TID  . loperamide  2 mg Oral QID  . magnesium oxide  400 mg Oral BID  . mouth rinse  15 mL Mouth Rinse q12n4p  . Warfarin - Pharmacist Dosing Inpatient   Does not apply q1800  . white petrolatum   Topical BID   Continuous Infusions:    LOS: 15 days    Time spent: 15 minutes  Pleas KochJai Siri Buege, MD [facility](P) [phone]   If 7PM-7AM, please contact night-coverage [email or web address] Password [email or web address] 11/11/2016, 10:47 AM

## 2016-11-12 ENCOUNTER — Inpatient Hospital Stay (HOSPITAL_COMMUNITY): Payer: Medicare Other

## 2016-11-12 LAB — MAGNESIUM: Magnesium: 1.4 mg/dL — ABNORMAL LOW (ref 1.7–2.4)

## 2016-11-12 LAB — GLUCOSE, CAPILLARY
GLUCOSE-CAPILLARY: 101 mg/dL — AB (ref 65–99)
GLUCOSE-CAPILLARY: 104 mg/dL — AB (ref 65–99)
Glucose-Capillary: 90 mg/dL (ref 65–99)
Glucose-Capillary: 100 mg/dL — ABNORMAL HIGH (ref 65–99)

## 2016-11-12 LAB — BASIC METABOLIC PANEL
ANION GAP: 8 (ref 5–15)
BUN: 12 mg/dL (ref 6–20)
CALCIUM: 8.7 mg/dL — AB (ref 8.9–10.3)
CO2: 33 mmol/L — AB (ref 22–32)
Chloride: 98 mmol/L — ABNORMAL LOW (ref 101–111)
Creatinine, Ser: 1.51 mg/dL — ABNORMAL HIGH (ref 0.61–1.24)
GFR calc Af Amer: 55 mL/min — ABNORMAL LOW (ref >60)
GFR calc non Af Amer: 47 mL/min — ABNORMAL LOW (ref >60)
GLUCOSE: 93 mg/dL (ref 65–99)
POTASSIUM: 4.3 mmol/L (ref 3.5–5.1)
Sodium: 139 mmol/L (ref 135–145)

## 2016-11-12 LAB — PROTIME-INR
INR: 3.22
PROTHROMBIN TIME: 33.6 s — AB (ref 11.4–15.2)

## 2016-11-12 MED ORDER — BOOST / RESOURCE BREEZE PO LIQD
1.0000 | Freq: Three times a day (TID) | ORAL | Status: DC
  Administered 2016-11-12 – 2016-11-13 (×3): 1 via ORAL

## 2016-11-12 MED ORDER — WARFARIN SODIUM 2 MG PO TABS
1.0000 mg | ORAL_TABLET | Freq: Once | ORAL | Status: AC
  Administered 2016-11-12: 1 mg via ORAL
  Filled 2016-11-12: qty 0.5

## 2016-11-12 MED ORDER — LEVALBUTEROL HCL 0.63 MG/3ML IN NEBU: 0.6300 mg | INHALATION_SOLUTION | Freq: Four times a day (QID) | RESPIRATORY_TRACT | Status: DC | PRN

## 2016-11-12 MED ORDER — ENSURE ENLIVE PO LIQD: 237.0000 mL | Freq: Three times a day (TID) | ORAL | Status: DC | PRN

## 2016-11-12 NOTE — Progress Notes (Signed)
Nutrition Follow-up   INTERVENTION:  Provide Boost Breeze po TID, each supplement provides 250 kcal and 9 grams of protein Provide Ensure Enlive po TID PRN, each supplement provides 350 kcal and 20 grams of protein Encourage PO intake  NUTRITION DIAGNOSIS:   Inadequate oral intake related to dysphagia, poor appetite as evidenced by meal completion < 50%.  Ongoing  GOAL:   Patient will meet greater than or equal to 90% of their needs  Unmet  MONITOR:   PO intake, Supplement acceptance, Labs, Skin  REASON FOR ASSESSMENT:   Consult Enteral/tube feeding initiation and management  ASSESSMENT:   65 y/o man with a history of CHF (EF 25-30%), AF on AC, as well as chronic respiratory disease (chart carries dx of COPD) with chronic hypoxic and hypercapnic respiratory failure  Per RN, pt is not eating and will only drinking a few sips of Ensure. He was nauseous this morning and vomited after eating a few bites of magic cup ice cream. Patient reports having episode of emesis during x-ray as well. RN reports that Zofran has helped pt's nausea, but he is still not eating. Weight history shows that patient has lost from 180 to 150 lbs since admission. RD emphasized the importance of nutrition and encouraged patient to eat. Offered alternative nutritional supplements and pt agreed to try [employer]ost Breeze.   Labs: low chloride, low calcium, low magnesium  Diet Order:  DIET DYS 2 Room service appropriate? Yes; Fluid consistency: Thin  Skin:  Wound (see comment) (Deep tissues injuries on head and nose)  Last BM:  12/9  Height:   Ht Readings from Last 1 Encounters:  11/06/16 5' 7.5" (1.715 m)    Weight:   Wt Readings from Last 1 Encounters:  11/12/16 155 lb (70.3 kg)    Ideal Body Weight:  67.2 kg  BMI:  Body mass index is 23.92 kg/m.  Estimated Nutritional Needs:   Kcal:  1900-2100  Protein:  100-115 grams  Fluid:  2 L/day  EDUCATION NEEDS:   No education needs  identified at this time  Dorothea Ogleeanne Poppi Scantling RD, CSP, LDN Inpatient Clinical Dietitian Pager: [phone] After Hours Pager: [phone]

## 2016-11-12 NOTE — Progress Notes (Signed)
Physical Therapy Treatment Patient Details Name: Van ClinesDouglas E Tony MRN: [id number] DOB: Dec 03, 1951 Today's Date: 11/12/2016    History of Present Illness pt presents with Respiratory Failure and Encephalopathy.  pt with hx of COPD, CHF, CKD, and A-fib.      PT Comments    Patient seen in conjunction with OT for therapy due to limited activity tolerance. Patient assisted with sitting balance and dynamic trunk activities EOB. Session and function limited by multiple bouts of vomiting. Nsg aware, brought medication to room. Deferred OOB at this time due to vomiting. Will continue to see and progress as tolerated.   Follow Up Recommendations  SNF     Equipment Recommendations  None recommended by PT    Recommendations for Other Services       Precautions / Restrictions Precautions Precautions: Fall;Other (comment) Precaution Comments: flexiseal; skiin integrity Restrictions Weight Bearing Restrictions: No    Mobility  Bed Mobility Overal bed mobility: Needs Assistance Bed Mobility: Rolling Rolling: Min assist   Supine to sit: Mod assist;+2 for safety/equipment Sit to supine: +2 for physical assistance;Mod assist   General bed mobility comments: min assist to roll bilaterally in bed. Moderate assist to elevate trunk to upright and rotate hips to EOB. ASsist to reposition and return to supine  Transfers                 General transfer comment: patient unable to perform OOB activity due to several bouts of vomiting, nsg aware  Ambulation/Gait             General Gait Details: unable   Stairs            Wheelchair Mobility    Modified Rankin (Stroke Patients Only)       Balance Overall balance assessment: Needs assistance Sitting-balance support: Single extremity supported;Feet supported Sitting balance-Leahy Scale: Fair Sitting balance - Comments: patient with reliance on side rail for UE support while sitting EOB. Postural control: Left  lateral lean                          Cognition Arousal/Alertness: Awake/alert Behavior During Therapy: Flat affect Overall Cognitive Status: No family/caregiver present to determine baseline cognitive functioning                      Exercises      General Comments General comments (skin integrity, edema, etc.): patient tolerated EOb ~15 minutes, assist for hygiene and dressing. Repositioned upon return to bed patient again with increased nausea and vomiting      Pertinent Vitals/Pain Pain Assessment: Faces Faces Pain Scale: Hurts little more Pain Location: stomach Pain Descriptors / Indicators: Grimacing;Discomfort Pain Intervention(s): Monitored during session    Home Living                      Prior Function            PT Goals (current goals can now be found in the care plan section) Acute Rehab PT Goals Patient Stated Goal: to get better PT Goal Formulation: Patient unable to participate in goal setting Time For Goal Achievement: 11/17/16 Potential to Achieve Goals: Good Progress towards PT goals: Not progressing toward goals - comment (limited by vomiting)    Frequency    Min 2X/week      PT Plan Current plan remains appropriate    Co-evaluation PT/OT/SLP Co-Evaluation/Treatment: Yes Reason for Co-Treatment: For patient/therapist safety PT goals  addressed during session: Mobility/safety with mobility       End of Session Equipment Utilized During Treatment: Oxygen Activity Tolerance: Treatment limited secondary to medical complications (Comment) (vomiting throughout session) Patient left: in bed;with call bell/phone within reach;with SCD's reapplied     Time: [id number] PT Time Calculation (min) (ACUTE ONLY): 23 min  Charges:  $Therapeutic Activity: 8-22 mins                    G Codes:      Fabio AsaDevon J Evalette Montrose 11/12/2016, 9:29 AM Charlotte Crumbevon Modine Oppenheimer, PT DPT  [phone]

## 2016-11-12 NOTE — Clinical Social Work Note (Signed)
CSW continues to follow for discharge needs. [facility]  Charlynn CourtSarah Isaura Schiller, CSW [phone]

## 2016-11-12 NOTE — Progress Notes (Signed)
PROGRESS NOTE    Dylan Hubbard  ZOX:[id number] DOB: February 04, 1951 DOA: 10/27/2016 PCP: Crist FatVAN EYK, JASON, MD   Brief Narrative:   65 y/o ?  Chronic Systolic CHF (EF 81-19%25-30%),  Atrial Fibrillation with RVR on anticoagulation,  HTN,  HLD,  Chronic Respiratory disease  (chart carries dx of COPD) with Chronic Hypoxic and Hypercapnic respiratory failure,  OSA.  DM type II uncontrolled with complication. [facility] to [facility] on 11/6 with progressively worsening dyspnea. He apparently had stopped his medications (which include lasix). He also had AF with RVR, with an attempt at cardioversion on 11/10 which was unsuccessful. He was rate (but not rhythm) controlled with amio and digoxin, both of which appeared to be new medications for him.  fever on 11/17, and CXR demonstrated RLL consolidation. He was initially treated with levofloxacin and vanc, but when his blood culture grew staph epi, it appears that he was narrowed to vanc only. He also had a steadily increasing creatinine from baseline <1 to ~3.0 on presentation to [facility]. [facility] was discharged from [facility] on [facility]/24, and a PICC line was placed on the day of discharge. His wife spoke with him after the procedure, and said he was "out of head"  because of the sedation he received for the procedure.  On the morning of 11/25, he was found to be poorly responsive and EMS was called.  Rather than re[facility], EMS [facility]. He found to be in acute on chronic hypercarbic respiratory failure. He was started on BiPAP, and after a few hours, he did have some improvement in his mental status and blood gas.  Since admission treated for COPD exacerbation/HCAP, now with profuse diarrhea [facility] to Floor today 12/6  Subjective:  Vomit since yesterday No abd pain Passing some but not much gas No cp No n/v   Assessment & Plan:  N/v Get DG acute follow  Acute on chronic hypercarbic / hypoxic  respiratory failure -due to COPD/CHF, improved, see below -continue Pulmicort 0.5 bid, Atrovent 0.5 tid and Xopenex tid   Metabolic Encephalopathy -Multifactorial to include Hypercarbia, COPD, HCAP -Resolving -SNF recommended by PT  COPD exacerbation/HCAP -Completed 7 day course antibiotics, stopped 12/1 -Pulmicort nebulizer BID -continue Atrovent/Xopenex -CXr done earlier 12/8 not suspicious for PNA-shows RLL but recently Rx for PNA.  monitor  Chronic Systolic CHF/Cardiorenal syndrome  -EF 40-45%, currently on Metoprolol 12.5 mg BID -resumed on 12/8 low dose Digoxin 0.125 -holding PO lasix due to diarrhea, resumed 12/7 -labs show stable creat  Atrial Fibrillation with RVR -Chad2svasc schore >3 - rate controlled -We have held metoprolol 12.5 bid 12/9 because of low blood pressure but continued amiodarone 200 qd -on warfarin  per pharmacy, INR 3.2 today -replacing Mag  Demand ischemia -Most likely combination of hypoxic respiratory failure, A. fib with RVR, renal failure.  -ECHO with diffuse hypokinesis, EF improved from prior known EF of 25-30%  Acute renal failure? Unknown baseline -Improving -due to cardiorenal syndrome, recent Vancomycin for contaminant staph epi at OSH  DM Type 2 Controlled With complication -Hba1c6.7 -SSI, stable now, sugars in the low 100 range  Positive culture (Staph Epi) in blood - 1/2 cultures, likely contaminant  -At outside hospital, however blood culture here negative  Diarrhea -ABx associated likely -C. difficile/GI panel negative -non infectious, increase imodium to QID -Flexi-seal was removed on 11/09/2016 and has had no diarrhea  DVT prophylaxis: Heparin per pharmacy--> Coumadin Code Status: Partial Family Communication: talked with  wife briefly.   Disposition Plan: SNF when stabilized   Consultants:  PCCM  Procedures/Significant Events:  11/27 echocardiogram:Left ventricle: mildly dilated. -LVEF =40% to 45%. Diffuse  hypokinesis.  11/25  started on BiPAP, [facility]   Cultures 11/25 blood 2 negative 11/25 MRSA by PCR negative    12/1 fecal lactoferrin pending 12/1 gastrointestinal panel negative 12/1 C. difficile panel negative   Antimicrobials: Vanc started at OSH 11/17 >>stopped?? Levofloxacin 11/17 >> ?? (~11/20?) Cefepime 11/25 >> 12/1   Devices    LINES / TUBES:  Rt PICC placed at OSH 11/24 >>    Continuous Infusions:    Objective: Vitals:   11/11/16 2045 11/12/16 0435 11/12/16 0741 11/12/16 0743  BP: (!) 93/53 101/61    Pulse: 62 63    Resp: 20 20    Temp: 98.2 F (36.8 C) 98.5 F (36.9 C)    TempSrc: Oral Oral    SpO2: 96% 97% 97% 97%  Weight:  70.3 kg (155 lb)    Height:        Intake/Output Summary (Last 24 hours) at 11/12/16 1130 Last data filed at 11/12/16 [id number]  Gross per 24 hour  Intake              250 ml  Output              525 ml  Net             -275 ml   Filed Weights   11/10/16 0649 11/11/16 0357 11/12/16 0435  Weight: 78 kg (172 lb) 68 kg (150 lb) 70.3 kg (155 lb)    Examination:  General:A/O 4, chronically ill appearing, No acute respiratory distress, just had neb HEENT: no JVD noted Lungs: Clear to auscultation bilaterally without wheezes or crackles Cardiovascular: Regular rate and rhythm without murmur gallop or rub normal S1 and S2 Abdomen: soft, NT, BS present, but diminsihed no reboudn no gaurding Extremities: No significant cyanosis, clubbing, or edema bilateral lower extremities Skin: Negative rashes, lesions, ulcers .  CBC:  Recent Labs Lab 11/07/16 0356 11/08/16 0412 11/09/16 0333 11/10/16 0355 11/11/16 0400  WBC 11.5* 10.6* 10.8* 11.0* 10.5  HGB 10.4* 10.4* 10.4* 10.3* 10.4*  HCT 32.5* 32.7* 32.1* 32.2* 32.4*  MCV 93.9 93.7 93.3 93.6 93.6  PLT 322 313 284 261 249   Basic Metabolic Panel:  Recent Labs Lab 11/06/16 0545  11/08/16 0412 11/08/16 0945 11/09/16 0333 11/10/16 0355 11/10/16 0830 11/11/16 0400  11/12/16 0447  NA 140  --   --  138  --   --  142 139 139  K 4.0  --   --  4.2  --   --  4.3 4.0 4.3  CL 103  --   --  101  --   --  102 98* 98*  CO2 31  --   --  30  --   --  32 31 33*  GLUCOSE 95  --   --  154*  --   --  126* 86 93  BUN 49*  --   --  31*  --   --  19 16 12   CREATININE 1.37*  --   --  1.39*  --   --  1.42* 1.49* 1.51*  CALCIUM 8.5*  --   --  8.6*  --   --  8.8* 8.6* 8.7*  MG 1.5*  < > 1.6*  --  1.4* 1.5*  --  1.3* 1.4*  < > =  values in this interval not displayed. GFR: Estimated Creatinine Clearance: 47 mL/min (by C-G formula based on SCr of 1.51 mg/dL (H)). Liver Function Tests:  Recent Labs Lab 11/06/16 0545 11/08/16 0945 11/11/16 0400  AST 124* 48* 27  ALT 88* 65* 38  ALKPHOS 102 133* 150*  BILITOT 0.5 0.4 0.6  PROT 4.2* 4.6* 4.6*  ALBUMIN 2.1* 2.2* 2.2*   No results for input(s): LIPASE, AMYLASE in the last 168 hours. No results for input(s): AMMONIA in the last 168 hours. Coagulation Profile:  Recent Labs Lab 11/08/16 0412 11/09/16 0333 11/10/16 0355 11/11/16 0400 11/12/16 0447  INR 2.15 2.55 2.84 3.04 3.22   Cardiac Enzymes: No results for input(s): CKTOTAL, CKMB, CKMBINDEX, TROPONINI in the last 168 hours. BNP (last 3 results) No results for input(s): PROBNP in the last 8760 hours. HbA1C: No results for input(s): HGBA1C in the last 72 hours. CBG:  Recent Labs Lab 11/11/16 0636 11/11/16 1102 11/11/16 1614 11/11/16 2043 11/12/16 0551  GLUCAP 83 96 91 111* 90   Lipid Profile: No results for input(s): CHOL, HDL, LDLCALC, TRIG, CHOLHDL, LDLDIRECT in the last 72 hours. Thyroid Function Tests: No results for input(s): TSH, T4TOTAL, FREET4, T3FREE, THYROIDAB in the last 72 hours. Anemia Panel: No results for input(s): VITAMINB12, FOLATE, FERRITIN, TIBC, IRON, RETICCTPCT in the last 72 hours. Urine analysis:    Component Value Date/Time   COLORURINE YELLOW 10/27/2016 1558   APPEARANCEUR CLOUDY (A) 10/27/2016 1558   LABSPEC 1.015  10/27/2016 1558   PHURINE 5.0 10/27/2016 1558   GLUCOSEU NEGATIVE 10/27/2016 1558   HGBUR NEGATIVE 10/27/2016 1558   BILIRUBINUR NEGATIVE 10/27/2016 1558   KETONESUR NEGATIVE 10/27/2016 1558   PROTEINUR NEGATIVE 10/27/2016 1558   NITRITE NEGATIVE 10/27/2016 1558   LEUKOCYTESUR NEGATIVE 10/27/2016 1558   Sepsis Labs: @LABRCNTIP (procalcitonin:4,lacticidven:4)  ) Recent Results (from the past 240 hour(s))  Gastrointestinal Panel by PCR , Stool     Status: None   Collection Time: 11/02/16  5:10 PM  Result Value Ref Range Status   Campylobacter species NOT DETECTED NOT DETECTED Final   Plesimonas shigelloides NOT DETECTED NOT DETECTED Final   Salmonella species NOT DETECTED NOT DETECTED Final   Yersinia enterocolitica NOT DETECTED NOT DETECTED Final   Vibrio species NOT DETECTED NOT DETECTED Final   Vibrio cholerae NOT DETECTED NOT DETECTED Final   Enteroaggregative E coli (EAEC) NOT DETECTED NOT DETECTED Final   Enteropathogenic E coli (EPEC) NOT DETECTED NOT DETECTED Final   Enterotoxigenic E coli (ETEC) NOT DETECTED NOT DETECTED Final   Shiga like toxin producing E coli (STEC) NOT DETECTED NOT DETECTED Final   Shigella/Enteroinvasive E coli (EIEC) NOT DETECTED NOT DETECTED Final   Cryptosporidium NOT DETECTED NOT DETECTED Final   Cyclospora cayetanensis NOT DETECTED NOT DETECTED Final   Entamoeba histolytica NOT DETECTED NOT DETECTED Final   Giardia lamblia NOT DETECTED NOT DETECTED Final   Adenovirus F40/41 NOT DETECTED NOT DETECTED Final   Astrovirus NOT DETECTED NOT DETECTED Final   Norovirus GI/GII NOT DETECTED NOT DETECTED Final   Rotavirus A NOT DETECTED NOT DETECTED Final   Sapovirus (I, II, IV, and V) NOT DETECTED NOT DETECTED Final  C difficile quick scan w PCR reflex     Status: None   Collection Time: 11/02/16  5:10 PM  Result Value Ref Range Status   C Diff antigen NEGATIVE NEGATIVE Final   C Diff toxin NEGATIVE NEGATIVE Final   C Diff interpretation No C.  difficile detected.  Final  Radiology Studies: No results found.      Scheduled Meds: . amiodarone  200 mg Oral Daily  . aspirin  81 mg Per Tube QPM  . atorvastatin  80 mg Oral QHS  . budesonide (PULMICORT) nebulizer solution  0.5 mg Nebulization BID  . chlorhexidine  15 mL Mouth Rinse BID  . digoxin  0.125 mg Oral QPM  . feeding supplement (ENSURE ENLIVE)  237 mL Oral TID WC  . furosemide  20 mg Oral Daily  . Gerhardt's butt cream   Topical TID  . insulin aspart  0-20 Units Subcutaneous TID WC  . insulin aspart  0-5 Units Subcutaneous QHS  . ipratropium  0.5 mg Nebulization TID  . levalbuterol  0.63 mg Nebulization TID  . loperamide  2 mg Oral QID  . magnesium oxide  400 mg Oral BID  . mouth rinse  15 mL Mouth Rinse q12n4p  . warfarin  1 mg Oral ONCE-1800  . Warfarin - Pharmacist Dosing Inpatient   Does not apply q1800  . white petrolatum   Topical BID   Continuous Infusions:    LOS: 16 days    Time spent: 15 minutes  Pleas Koch, MD [facility]   If 7PM-7AM, please contact night-coverage [email or web address] Password [email or web address] 11/12/2016, 11:30 AM

## 2016-11-12 NOTE — Progress Notes (Signed)
Occupational Therapy Treatment Patient Details Name: Dylan Hubbard MRN: [id number] DOB: 1951-08-04 Today's Date: 11/12/2016    History of present illness pt presents with Respiratory Failure and Encephalopathy.  pt with hx of COPD, CHF, CKD, and A-fib.     OT comments  Pt tolerated sitting EOB ~15 minutes this session with VSS throughout. Pt able to perform self feeding and grooming at bed level with set up, required mod assist for UB bathing in sitting. Session limited by pt c/o nausea with emesis throughout. D/c plan remains appropriate. Will continue to follow acutely.   Follow Up Recommendations  SNF;Supervision/Assistance - 24 hour    Equipment Recommendations  None recommended by OT    Recommendations for Other Services      Precautions / Restrictions Precautions Precautions: Fall;Other (comment) Precaution Comments: flexiseal; skiin integrity Restrictions Weight Bearing Restrictions: No       Mobility Bed Mobility Overal bed mobility: Needs Assistance Bed Mobility: Rolling;Supine to Sit;Sit to Supine Rolling: Min assist   Supine to sit: Mod assist;+2 for safety/equipment Sit to supine: +2 for physical assistance;Mod assist   General bed mobility comments: min assist to roll bilaterally in bed. Moderate assist to elevate trunk to upright and rotate hips to EOB. ASsist to reposition and return to supine  Transfers                 General transfer comment: patient unable to perform OOB activity due to several bouts of vomiting, nsg aware    Balance Overall balance assessment: Needs assistance Sitting-balance support: Single extremity supported;Feet supported Sitting balance-Leahy Scale: Fair Sitting balance - Comments: patient with reliance on side rail for UE support while sitting EOB. Postural control: Left lateral lean                         ADL Overall ADL's : Needs assistance/impaired Eating/Feeding: Set up;Bed level Eating/Feeding  Details (indicate cue type and reason): Pt repositioned in bed with HOB elevated. Set up for drinking orange juice. Grooming: Set up;Wash/dry face;Bed level Grooming Details (indicate cue type and reason): Pt able to wash face with washcloth at bed level with set up.         Upper Body Dressing : Moderate assistance;Sitting Upper Body Dressing Details (indicate cue type and reason): to doff/don hospital gown                   General ADL Comments: Pt c/o nausea with emesis this session. Pt agreeable to sit EOB but quick to fatigue. Able to tolerate sitting EOB ~15 minutes with VSS throughout.      Vision                     Perception     Praxis      Cognition   Behavior During Therapy: Flat affect Overall Cognitive Status: No family/caregiver present to determine baseline cognitive functioning                       Extremity/Trunk Assessment               Exercises     Shoulder Instructions       General Comments      Pertinent Vitals/ Pain       Pain Assessment: Faces Faces Pain Scale: Hurts little more Pain Location: back/abdomen, LEs Pain Descriptors / Indicators: Grimacing;Discomfort Pain Intervention(s): Monitored during session  Home Living  Prior Functioning/Environment              Frequency  Min 2X/week        Progress Toward Goals  OT Goals(current goals can now be found in the care plan section)  Progress towards OT goals: Progressing toward goals  Acute Rehab OT Goals Patient Stated Goal: to get better OT Goal Formulation: With patient  Plan Discharge plan remains appropriate    Co-evaluation    PT/OT/SLP Co-Evaluation/Treatment: Yes Reason for Co-Treatment: For patient/therapist safety PT goals addressed during session: Mobility/safety with mobility OT goals addressed during session: ADL's and self-care      End of Session Equipment  Utilized During Treatment: Oxygen   Activity Tolerance Patient limited by fatigue;Other (comment) (Limited by nausea with emesis)   Patient Left in bed;with call bell/phone within reach   Nurse Communication Mobility status        Time: [id number] OT Time Calculation (min): 23 min  Charges: OT General Charges $OT Visit: 1 Procedure OT Treatments $Self Care/Home Management : 8-22 mins  Gaye AlkenBailey A Caleb Decock M.S., OTR/L Pager: [phone]  11/12/2016, 9:32 AM

## 2016-11-12 NOTE — Progress Notes (Signed)
ANTICOAGULATION CONSULT NOTE - Follow Up Consult  Pharmacy Consult for warfarin Indication: atrial fibrillation  Allergies  Allergen Reactions  . Penicillins Rash    No other information available at this time    Patient Measurements: Height: 5' 7.5" (171.5 cm) Weight: 155 lb (70.3 kg) IBW/kg (Calculated) : 67.25 Heparin Dosing Weight: 83.6 kg  Vital Signs: Temp: 98.5 F (36.9 C) (12/11 0435) Temp Source: Oral (12/11 0435) BP: 101/61 (12/11 0435) Pulse Rate: 63 (12/11 0435)   Assessment: 65 yoM on warfarinPTA for AFib (home dose = 7.5mg  daily).  Initially bridged with heparin drip while INR subtherapeutic, but discontinued 12/7 once INR > 2.0. INR supratherapeutic today at 3.22, dose not given 12/8, INR trending up likely from 10 mg doses given earlier and low PO intake.   Goal of Therapy:  INR 2-3 Monitor platelets by anticoagulation protocol: Yes   Plan:  Repeat warfarin 1mg  PO x1 tonight Monitor daily INR, CBC, clinical course, s/sx of bleed, PO intake, DDI   Thank you for allowing us to participate in this patients care. Signe Coltonya C Lukasz Rogus, PharmD Pager: [phone] 11/12/2016

## 2016-11-13 LAB — GLUCOSE, CAPILLARY
GLUCOSE-CAPILLARY: 154 mg/dL — AB (ref 65–99)
Glucose-Capillary: 95 mg/dL (ref 65–99)

## 2016-11-13 LAB — PROTIME-INR
INR: 3.31
PROTHROMBIN TIME: 34.4 s — AB (ref 11.4–15.2)

## 2016-11-13 LAB — MAGNESIUM: Magnesium: 1.4 mg/dL — ABNORMAL LOW (ref 1.7–2.4)

## 2016-11-13 MED ORDER — LEVALBUTEROL HCL 0.63 MG/3ML IN NEBU: 0.6300 mg | INHALATION_SOLUTION | Freq: Three times a day (TID) | RESPIRATORY_TRACT | 12 refills | Status: AC

## 2016-11-13 MED ORDER — ENSURE ENLIVE PO LIQD: 237.0000 mL | Freq: Three times a day (TID) | ORAL | 12 refills | Status: AC | PRN

## 2016-11-13 MED ORDER — ONDANSETRON 4 MG PO TBDP: 4.0000 mg | ORAL_TABLET | Freq: Three times a day (TID) | ORAL | 0 refills | Status: AC | PRN

## 2016-11-13 MED ORDER — WARFARIN SODIUM 2 MG PO TABS: 1.0000 mg | ORAL_TABLET | Freq: Once | ORAL | Status: DC

## 2016-11-13 MED ORDER — BUDESONIDE 0.5 MG/2ML IN SUSP: 0.5000 mg | Freq: Two times a day (BID) | RESPIRATORY_TRACT | Status: AC

## 2016-11-13 MED ORDER — WARFARIN SODIUM 1 MG PO TABS: 1.0000 mg | ORAL_TABLET | Freq: Once | ORAL | 0 refills | Status: AC

## 2016-11-13 MED ORDER — MAGNESIUM OXIDE 400 (241.3 MG) MG PO TABS: 400.0000 mg | ORAL_TABLET | Freq: Two times a day (BID) | ORAL | Status: AC

## 2016-11-13 MED ORDER — LOPERAMIDE HCL 1 MG/5ML PO LIQD: 2.0000 mg | ORAL | 0 refills | Status: AC | PRN

## 2016-11-13 NOTE — Discharge Summary (Addendum)
Physician Discharge Summary  Dylan Hubbard [id number] DOB: [birth year] DOA: 10/27/2016  PCP: Crist FatVAN EYK, JASON, MD  Admit date: 10/27/2016 Discharge date: 11/13/2016  Time spent: 35 minutes  Recommendations for Outpatient Follow-up:  1. Patient has had episodic nausea relate dot positional changes-please medicate with zofran if vomiting.  WOrk slowly with him to sit him up in the bed 2. Needs bmet and cbc 1 week 3. Needs INr in 3-4 days 4. replace magnesium as needed 5. HR baseline ranges 110-120--limited control because of low blood pressure 6. Would medicate for constipation if recurrent diarrhea-this has been treated with imodium in the hospital and was non-infectious   Discharge Diagnoses:  Active Problems:   Respiratory failure (HCC)   Pressure injury of skin   Acute encephalopathy   Acute hypoxemic respiratory failure (HCC)   AKI (acute kidney injury) (HCC)   Respiratory distress   Swelling of arm   Acute respiratory failure with hypoxia (HCC)   Chronic systolic CHF (congestive heart failure) (HCC)   Cardiorenal syndrome   Acute pulmonary edema (HCC)   Atrial fibrillation with RVR (HCC)   HCAP (healthcare-associated pneumonia)   Diarrhea   Encephalopathy, metabolic   Panlobular emphysema (HCC)   Acute on chronic respiratory failure with hypoxia (HCC)   Demand ischemia (HCC)   Acute renal failure (HCC)   Controlled diabetes mellitus type 2 with complications (HCC)   COPD exacerbation (HCC)   Acute on chronic respiratory failure with hypoxia and hypercapnia (HCC)   Discharge Condition: fair  Diet recommendation: hh low salt diabetic  Filed Weights   11/11/16 0357 11/12/16 0435 11/13/16 0353  Weight: 68 kg (150 lb) 70.3 kg (155 lb) 70.8 kg (156 lb)    History of present illness:  65 y/o ?  Chronic Systolic CHF (EF 84-69%25-30%),  Atrial Fibrillation with RVR on anticoagulation,  HTN,  HLD,  Chronic Respiratory disease  (chart carries dx of COPD) with Chronic  Hypoxic and Hypercapnic respiratory failure,  OSA.  DM type II uncontrolled with complication. Presented to [facility] on 11/6 with progressively worsening dyspnea. He apparently had stopped his medications (which include lasix). He also had AF with RVR, with an attempt at cardioversion on 11/10 which was unsuccessful. He was rate (but not rhythm) controlled with amio and digoxin, both of which appeared to be new medications for him.  fever on 11/17, and CXR demonstrated RLL consolidation. He was initially treated with levofloxacin and vanc, but when his blood culture grew staph epi, it appears that he was narrowed to vanc only. He also had a steadily increasing creatinine from baseline <1 to ~3.0 on [facility]. He was discharged from [facility] on 11/24, and a PICC line was placed on the day of discharge. His wife spoke with him after the procedure, and said he was "out of head"  because of the sedation he received for the procedure.  On the morning of 11/25, he was found to be poorly responsive and EMS was called.  Rather than re[facility] to [facility], EMS [facility]. He found to be in acute on chronic hypercarbic respiratory failure. He was started on BiPAP, and after a few hours, he did have some improvement in his mental status and blood gas.  Since admission treated for COPD exacerbation/HCAP  Hospital Course:  N/v Get DG acute No acute cause follow  Acute on chronic hypercarbic / hypoxic respiratory failure -due to COPD/CHF, improved, see below -continue Pulmicort 0.5 bid, Atrovent 0.5 tid and Xopenex  tid   Metabolic Encephalopathy -Multifactorial to include Hypercarbia, COPD, HCAP -Resolving -SNF recommended by PT  COPD exacerbation/HCAP -Completed 7 day course antibiotics, stopped 12/1 -Pulmicort nebulizer BID -continue Atrovent/Xopenex -CXr done earlier 12/8 not suspicious for PNA-shows RLL but recently Rx for PNA.  monitor  Chronic Systolic  CHF/Cardiorenal syndrome  -EF 40-45%, currently on Metoprolol 12.5 mg BID -resumed on 12/8 low dose Digoxin 0.125 -holding PO lasix due to diarrhea, resumed 12/7 -labs show stable creat  Atrial Fibrillation with RVR -Chad2svasc schore >3 - rate controlled -We have held metoprolol 12.5 bid 12/9 because of low blood pressure but continued amiodarone 200 qd -on warfarin  per pharmacy, INR 3.31 on d/c to SNF -replacing Mag  Demand ischemia -Most likely combination of hypoxic respiratory failure, A. fib with RVR, renal failure.  -ECHO with diffuse hypokinesis, EF improved from prior known EF of 25-30%  Acute renal failure? Unknown baseline -Improving -due to cardiorenal syndrome, recent Vancomycin for contaminant staph epi at OSH  DM Type 2 Controlled With complication -Hba1c6.7 -SSI, stable now, sugars in the low 100 range  Positive culture (Staph Epi) in blood - 1/2 cultures, likely contaminant  -At outside hospital, however blood culture here negative  Diarrhea -ABx associated likely -C. difficile/GI panel negative -non infectious, increase imodium to QID -Flexi-seal was removed on 11/09/2016 and has had no diarrhea    Discharge Exam: Vitals:   11/12/16 2005 11/13/16 0346  BP: (!) 91/41 103/70  Pulse: (!) 54 (!) 56  Resp: 14 16  Temp: 98.8 F (37.1 C) 98.4 F (36.9 C)    General: alert pleasant some mild nausea this am overall feels about the same Cardiovascular: s1 s 2 irreg Respiratory: clear no added sound abd soft, no rebound no guard No le edema  Discharge Instructions   Discharge Instructions    Diet - low sodium heart healthy    Complete by:  As directed    Increase activity slowly    Complete by:  As directed      Current Discharge Medication List    START taking these medications   Details  budesonide (PULMICORT) 0.5 MG/2ML nebulizer solution Take 2 mLs (0.5 mg total) by nebulization 2 (two) times daily. Qty: 100 mL    feeding  supplement, ENSURE ENLIVE, (ENSURE ENLIVE) LIQD Take 237 mLs by mouth 3 (three) times daily as needed (Offer if patient refuses Boost/Resource Breeze). Qty: 237 mL, Refills: 12    levalbuterol (XOPENEX) 0.63 MG/3ML nebulizer solution Take 3 mLs (0.63 mg total) by nebulization 3 (three) times daily. Qty: 3 mL, Refills: 12    loperamide (IMODIUM) 1 MG/5ML solution Take 10 mLs (2 mg total) by mouth as needed for diarrhea or loose stools. Qty: 120 mL, Refills: 0    magnesium oxide (MAG-OX) 400 (241.3 Mg) MG tablet Take 1 tablet (400 mg total) by mouth 2 (two) times daily.    ondansetron (ZOFRAN ODT) 4 MG disintegrating tablet Take 1 tablet (4 mg total) by mouth every 8 (eight) hours as needed for nausea or vomiting. Qty: 20 tablet, Refills: 0      CONTINUE these medications which have NOT CHANGED   Details  ALPRAZolam (XANAX) 0.5 MG tablet Take 0.5 mg by mouth every 8 (eight) hours as needed for anxiety.    amiodarone (PACERONE) 200 MG tablet Take 200 mg by mouth daily.    aspirin EC 81 MG tablet Take 81 mg by mouth every evening. 4pm    atorvastatin (LIPITOR) 80 MG tablet Take  80 mg by mouth at bedtime.    benzonatate (TESSALON) 100 MG capsule Take 100 mg by mouth every 8 (eight) hours as needed for cough.    citalopram (CELEXA) 20 MG tablet Take 20 mg by mouth at bedtime.    digoxin (LANOXIN) 0.125 MG tablet Take 0.125 mg by mouth every evening. 5pm    furosemide (LASIX) 20 MG tablet Take 20 mg by mouth.    insulin regular (NOVOLIN R,HUMULIN R) 250 units/2.58mL (100 units/mL) injection Inject 0-8 Units into the skin 4 (four) times daily -  before meals and at bedtime. Per sliding scale: CBG 0-200 0 units, 201-250 2 units, 251-300 4 units, 301-350 6 units, 351-400 8 units, >400 contact MD    ipratropium-albuterol (DUONEB) 0.5-2.5 (3) MG/3ML SOLN Take 3 mLs by nebulization every hour as needed (COPD).    OXYGEN Inhale 2 L into the lungs continuous.      STOP taking these  medications     metoprolol tartrate (LOPRESSOR) 25 MG tablet      vancomycin IVPB        Allergies  Allergen Reactions  . Penicillins Rash    No other information available at this time   Contact information for after-discharge care    Destination    [facility] .   Specialty:  Skilled Nursing Facility Contact information: [address] [place] [address] [phone]               The results of significant diagnostics from this hospitalization (including imaging, microbiology, ancillary and laboratory) are listed below for reference.    Significant Diagnostic Studies: Dg Chest 2 View  Result Date: 11/09/2016 CLINICAL DATA:  Pneumonia. EXAM: CHEST  2 VIEW COMPARISON:  11/01/2016 FINDINGS: Patient's left-sided PICC line, tip overlying the level of the superior vena cava. A right-sided PICC line has been removed. Feeding tube has been removed. There are bilateral pleural effusions. Persistent opacity at the right lung base. There is increased opacity at the medial left lung base. Heart size is normal. No pulmonary edema. IMPRESSION: Increased opacity at the left lung base consistent with atelectasis or infiltrate. Persistent bilateral pleural effusions and right basilar opacity consistent with infiltrate. Electronically Signed   By: Norva Pavlov M.D.   On: 11/09/2016 15:14   Ct Head Wo Contrast  Result Date: 10/27/2016 CLINICAL DATA:  Altered mental status EXAM: CT HEAD WITHOUT CONTRAST TECHNIQUE: Contiguous axial images were obtained from the base of the skull through the vertex without intravenous contrast. COMPARISON:  None. FINDINGS: Brain: No evidence of acute infarction, hemorrhage, hydrocephalus, extra-axial collection or mass lesion/mass effect. Mild cerebral atrophy. Mild periventricular white matter decreased attenuation probable due to chronic small vessel ischemic changes. Vascular: Atherosclerotic calcifications of carotid  siphon are noted. Skull: Normal. Negative for fracture or focal lesion. Sinuses/Orbits: No acute finding. Other: None. IMPRESSION: No acute intracranial abnormality. Mild cerebral atrophy. Mild periventricular white matter decreased attenuation probable due to chronic small vessel ischemic changes. Atherosclerotic calcifications of carotid siphon. Electronically Signed   By: Natasha Mead M.D.   On: 10/27/2016 17:08   Dg Chest Port 1 View  Result Date: 11/01/2016 CLINICAL DATA:  LEFT arm PICC line placement EXAM: PORTABLE CHEST 1 VIEW COMPARISON:  Portable exam 1547 hours compared to 0701 hours FINDINGS: BILATERAL upper extremity PICC lines with tips projecting over SVC. Feeding tube extends to at least the inferior mediastinum. Normal heart size and mediastinal contours. Mild pulmonary vascular congestion. Persistent RIGHT lower lobe  infiltrate. Remaining lungs clear. No pleural effusion or pneumothorax. Mild central peribronchial thickening again identified. Atherosclerotic calcification aorta. IMPRESSION: Tip of BILATERAL upper extremity PICC lines project over SVC above cavoatrial junction. Bronchitic changes with persistent RIGHT lower lobe infiltrate. Aortic atherosclerosis. Electronically Signed   By: Ulyses Southward M.D.   On: 11/01/2016 16:00   Dg Chest Port 1 View  Result Date: 11/01/2016 CLINICAL DATA:  Shortness of breath . EXAM: PORTABLE CHEST 1 VIEW COMPARISON:  10/31/2016 . FINDINGS: Feeding tube in stable position. Right PICC line stable position. Heart size stable. Right lower lobe infiltrate consistent pneumonia noted. Small right pleural effusion cannot be excluded . IMPRESSION: 1. Feeding tube and right PICC line stable position. 2. Right lower lobe infiltrate consistent with pneumonia again noted. No change. Small right pleural effusion again noted. Electronically Signed   By: Maisie Fus  Register   On: 11/01/2016 07:40   Dg Chest Port 1 View  Result Date: 10/31/2016 CLINICAL DATA:  Acute  respiratory failure with hypoxia. EXAM: PORTABLE CHEST 1 VIEW COMPARISON:  10/30/2016 FINDINGS: PICC line tip near the superior cavoatrial junction. Again noted are densities in the mid and lower right chest with evidence for a right pleural effusion. Left lung is clear. Heart size is normal. Feeding tube extends into the abdomen. The trachea is midline. Negative for a pneumothorax. IMPRESSION: Persistent densities in the right lower chest are suggestive for a combination airspace disease and pleural fluid. Minimal change from the prior examination. Electronically Signed   By: Richarda Overlie M.D.   On: 10/31/2016 07:57   Dg Chest Port 1 View  Result Date: 10/30/2016 CLINICAL DATA:  Acute respiratory failure. Shortness breath and congestive heart failure. EXAM: PORTABLE CHEST 1 VIEW COMPARISON:  One-view chest x-ray 10/27/2016 FINDINGS: Heart size normal. Right-sided PICC line terminates in the distal SVC. Asymmetric right pleural effusion and airspace disease has increased slightly. Moderate pulmonary vascular congestion is present. Minimal left basilar airspace disease likely reflects atelectasis. IMPRESSION: 1. Increasing right pleural effusion and asymmetric lower lobe airspace disease is concerning for pneumonia. 2. Moderate pulmonary vascular congestion. 3. Mild left basilar airspace disease is stable, likely reflecting atelectasis. Electronically Signed   By: Marin Roberts M.D.   On: 10/30/2016 07:49   Dg Chest Port 1 View  Result Date: 10/28/2016 CLINICAL DATA:  65 y/o  M; respiratory distress. EXAM: PORTABLE CHEST 1 VIEW COMPARISON:  10/27/2016 chest radiograph. FINDINGS: Stable cardiomediastinal silhouette. Right PICC line tip projects over cavoatrial junction. Ill-defined right basilar opacity probably represents effusion atelectasis. Underlying pneumonia is not excluded. No significant interval change. Bones are unremarkable. Pulmonary venous hypertension. IMPRESSION: Stable right basilar  opacity probably representing effusion and atelectasis. Pneumonia is not excluded. No new airspace disease identified. Pulmonary venous hypertension. Electronically Signed   By: Mitzi Hansen M.D.   On: 10/28/2016 00:07   Dg Chest Portable 1 View  Result Date: 10/27/2016 CLINICAL DATA:  65 year old male with a history of PICC confirmation EXAM: PORTABLE CHEST 1 VIEW COMPARISON:  10/25/2016, 10/21/2016 FINDINGS: Right upper extremity PICC, with the tip appearing to terminate superior cavoatrial junction. Asymmetric mixed interstitial and airspace opacity of the right lung. Blunting of the right costophrenic angle and cardiophrenic angle with hazy lower lung opacity. Left lung relatively well aerated. No pneumothorax. IMPRESSION: Right-sided opacity, likely combination of atelectasis/consolidation, edema, and right-sided pleural fluid. Right upper extremity PICC, with the tip appearing to terminate superior cavoatrial junction. Aortic atherosclerosis. Signed, Yvone Neu. Loreta Ave, DO [facility]  Radiology Electronically Signed   By: Gilmer Mor D.O.   On: 10/27/2016 14:11   Dg Abd 2 Views  Result Date: 11/12/2016 CLINICAL DATA:  Abdominal pain.  Possible small bowel obstruction. EXAM: ABDOMEN - 2 VIEW COMPARISON:  Single-view of the abdomen 10/31/2016 and 10/29/2016. FINDINGS: The bowel gas pattern is normal. Contrast material is seen in the rectosigmoid colon. Convex left scoliosis is noted. The patient is status post cholecystectomy. IMPRESSION: Negative for bowel obstruction.  No acute abnormality. Electronically Signed   By: Drusilla Kanner M.D.   On: 11/12/2016 13:45   Dg Abd Portable 1v  Result Date: 10/31/2016 CLINICAL DATA:  Check feeding catheter placement EXAM: PORTABLE ABDOMEN - 1 VIEW COMPARISON:  10/29/2016 FINDINGS: Feeding tube is again identified in the distal aspect of the duodenum near the ligament of Treitz. The overall  positioning is relatively stable. Scattered large and small bowel gas is noted. Degenerative change of the lumbar spine is seen. IMPRESSION: Feeding catheter as described. Electronically Signed   By: Alcide Clever M.D.   On: 10/31/2016 18:39   Dg Abd Portable 1v  Result Date: 10/29/2016 CLINICAL DATA:  Feeding tube placement EXAM: PORTABLE ABDOMEN - 1 VIEW COMPARISON:  04/20/2016 CT FINDINGS: The tip of a feeding tube is seen along the distal aspect of the third portion of the duodenum, possibly at the junction of the fourth portion. Lung volumes are low with patchy appearance of the visualized right lung. Surgical clips are seen in the right upper quadrant. The bowel gas pattern is unremarkable. Chronic superior endplate compression of L2. Lower lumbar facet arthropathy at L4-5 and L5-S1. No acute appearing osseous abnormality. IMPRESSION: The tip of a feeding tube is in the distal third portion of the duodenum possibly at the juncture with the fourth portion. Electronically Signed   By: Tollie Eth M.D.   On: 10/29/2016 18:04   Dg Swallowing Func-speech Pathology  Result Date: 11/02/2016 Objective Swallowing Evaluation: Type of Study: MBS-Modified Barium Swallow Study Patient Details Name: KALLON CAYLOR MRN: [id number] Date of Birth: November 14, 1951 Today's Date: 11/02/2016 Time: SLP Start Time (ACUTE ONLY): 1330-SLP Stop Time (ACUTE ONLY): 1400 SLP Time Calculation (min) (ACUTE ONLY): 30 min Past Medical History: Past Medical History: Diagnosis Date . Anxiety  . Atrial fibrillation (HCC)  . CHF (congestive heart failure) (HCC)  . Coronary artery disease  . Diabetes mellitus without complication (HCC)  . GERD (gastroesophageal reflux disease)  . High cholesterol  . Hypertension  . Pneumonia  . Sepsis (HCC)  . Sleep apnea  Past Surgical History: No past surgical history on file. HPI: 65 yo man with COPD, dCHF, CKD, fecently at [facility] with resp `failure, volume overload, A Fib + RVR. Developed RLL HCAP. Was  discharged 11/24 but was quickly readmitted with multisystem failure - encephalopathy, combined hypoxic and hypercapneic resp failure, acute on chronic renal failure. He was treated w BiPAP with some stabilization. Subjective: Pt sitting up in chair. Flat affect. Required cues to participate. Assessment / Plan / Recommendation CHL IP CLINICAL IMPRESSIONS 11/02/2016 Therapy Diagnosis Mild pharyngeal phase dysphagia;Mild oral phase dysphagia Clinical Impression Pt currently with a mild oropharyngeal, sensorimotor dysphagia. Pt had premature spillage to the level of the vallecula and reduced laryngeal elevation resulting in silent penetration to the level of the vocal folds only x1 of thin liquids; cleared when cued pt to cough. Numerous additional trials of thin liquid attempted by cup and straw with no further penetration or aspiration. Pt had mild residuals at the  base of tongue/ vallecula across consistencies which were cleared when cued to swallow a 2nd time. Pt continues to be at an increased risk of aspiration given these findings and current respiratory status. Recommend initiating dysphagia 2 diet, thin liquids, meds crushed in puree, full supervision during meals: cue small bites/ sips, cue pt to swallow 2 times, minimize distractions, only feed when alert. Will continue to follow for diet tolerance/ consider advancement. Impact on safety and function Moderate aspiration risk   CHL IP TREATMENT RECOMMENDATION 11/02/2016 Treatment Recommendations Therapy as outlined in treatment plan below   Prognosis 11/02/2016 Prognosis for Safe Diet Advancement Good Barriers to Reach Goals -- Barriers/Prognosis Comment -- CHL IP DIET RECOMMENDATION 11/02/2016 SLP Diet Recommendations Dysphagia 2 (Fine chop) solids;Thin liquid Liquid Administration via Cup;Straw Medication Administration Crushed with puree Compensations Slow rate;Small sips/bites;Minimize environmental distractions;Multiple dry swallows after each bite/sip  Postural Changes Seated upright at 90 degrees;Remain semi-upright after after feeds/meals (Comment)   CHL IP OTHER RECOMMENDATIONS 11/02/2016 Recommended Consults -- Oral Care Recommendations Oral care BID Other Recommendations Clarify dietary restrictions   CHL IP FOLLOW UP RECOMMENDATIONS 11/02/2016 Follow up Recommendations 24 hour supervision/assistance   CHL IP FREQUENCY AND DURATION 11/02/2016 Speech Therapy Frequency (ACUTE ONLY) min 2x/week Treatment Duration 2 weeks      CHL IP ORAL PHASE 11/02/2016 Oral Phase Impaired Oral - Pudding Teaspoon -- Oral - Pudding Cup -- Oral - Honey Teaspoon -- Oral - Honey Cup -- Oral - Nectar Teaspoon -- Oral - Nectar Cup -- Oral - Nectar Straw Premature spillage;Lingual/palatal residue Oral - Thin Teaspoon -- Oral - Thin Cup Lingual/palatal residue;Premature spillage Oral - Thin Straw Lingual/palatal residue;Premature spillage Oral - Puree WFL Oral - Mech Soft -- Oral - Regular Lingual/palatal residue;Premature spillage Oral - Multi-Consistency -- Oral - Pill -- Oral Phase - Comment --  CHL IP PHARYNGEAL PHASE 11/02/2016 Pharyngeal Phase Impaired Pharyngeal- Pudding Teaspoon -- Pharyngeal -- Pharyngeal- Pudding Cup -- Pharyngeal -- Pharyngeal- Honey Teaspoon -- Pharyngeal -- Pharyngeal- Honey Cup -- Pharyngeal -- Pharyngeal- Nectar Teaspoon -- Pharyngeal -- Pharyngeal- Nectar Cup -- Pharyngeal -- Pharyngeal- Nectar Straw Reduced laryngeal elevation;Delayed swallow initiation-vallecula;Pharyngeal residue - valleculae Pharyngeal -- Pharyngeal- Thin Teaspoon -- Pharyngeal -- Pharyngeal- Thin Cup Penetration/Aspiration during swallow;Reduced epiglottic inversion;Reduced laryngeal elevation;Reduced airway/laryngeal closure;Pharyngeal residue - valleculae Pharyngeal Material enters airway, CONTACTS cords and not ejected out Pharyngeal- Thin Straw Delayed swallow initiation-vallecula;Reduced laryngeal elevation;Pharyngeal residue - valleculae Pharyngeal -- Pharyngeal- Puree WFL  Pharyngeal -- Pharyngeal- Mechanical Soft -- Pharyngeal -- Pharyngeal- Regular Pharyngeal residue - valleculae Pharyngeal -- Pharyngeal- Multi-consistency -- Pharyngeal -- Pharyngeal- Pill -- Pharyngeal -- Pharyngeal Comment --  CHL IP CERVICAL ESOPHAGEAL PHASE 11/02/2016 Cervical Esophageal Phase WFL Pudding Teaspoon -- Pudding Cup -- Honey Teaspoon -- Honey Cup -- Nectar Teaspoon -- Nectar Cup -- Nectar Straw -- Thin Teaspoon -- Thin Cup -- Thin Straw -- Puree -- Mechanical Soft -- Regular -- Multi-consistency -- Pill -- Cervical Esophageal Comment -- No flowsheet data found. Metro Kung, MA, CCC-SLP 11/02/2016, 2:50 PM [phone]              Microbiology: No results found for this or any previous visit (from the past 240 hour(s)).   Labs: Basic Metabolic Panel:  Recent Labs Lab 11/08/16 0945 11/09/16 0333 11/10/16 0355 11/10/16 0830 11/11/16 0400 11/12/16 0447 11/13/16 0450  NA 138  --   --  142 139 139  --   K 4.2  --   --  4.3 4.0 4.3  --  CL 101  --   --  102 98* 98*  --   CO2 30  --   --  32 31 33*  --   GLUCOSE 154*  --   --  126* 86 93  --   BUN 31*  --   --  19 16 12   --   CREATININE 1.39*  --   --  1.42* 1.49* 1.51*  --   CALCIUM 8.6*  --   --  8.8* 8.6* 8.7*  --   MG  --  1.4* 1.5*  --  1.3* 1.4* 1.4*   Liver Function Tests:  Recent Labs Lab 11/08/16 0945 11/11/16 0400  AST 48* 27  ALT 65* 38  ALKPHOS 133* 150*  BILITOT 0.4 0.6  PROT 4.6* 4.6*  ALBUMIN 2.2* 2.2*   No results for input(s): LIPASE, AMYLASE in the last 168 hours. No results for input(s): AMMONIA in the last 168 hours. CBC:  Recent Labs Lab 11/07/16 0356 11/08/16 0412 11/09/16 0333 11/10/16 0355 11/11/16 0400  WBC 11.5* 10.6* 10.8* 11.0* 10.5  HGB 10.4* 10.4* 10.4* 10.3* 10.4*  HCT 32.5* 32.7* 32.1* 32.2* 32.4*  MCV 93.9 93.7 93.3 93.6 93.6  PLT 322 313 284 261 249   Cardiac Enzymes: No results for input(s): CKTOTAL, CKMB, CKMBINDEX, TROPONINI in the last 168 hours. BNP: BNP (last 3  results)  Recent Labs  10/27/16 2224  BNP 324.4*    ProBNP (last 3 results) No results for input(s): PROBNP in the last 8760 hours.  CBG:  Recent Labs Lab 11/12/16 0551 11/12/16 1144 11/12/16 1603 11/12/16 2112 11/13/16 0627  GLUCAP 90 100* 101* 104* 95       Signed:  Rhetta MuraSAMTANI, JAI-GURMUKH MD   [facility] 11/13/2016, 8:49 AM

## 2016-11-13 NOTE — Care Management Important Message (Signed)
Important Message  Patient Details  Name: Dylan Hubbard MRN: [id number] Date of Birth: January 01, 1951   Medicare Important Message Given:  Yes    [name] 11/13/2016, 12:14 PM

## 2016-11-13 NOTE — Progress Notes (Signed)
Pt has orders to be discharged. Pt AOx4 and agreeable to discharge to SNF. Telemetry box removed. Pt stable and waiting for transportation. Wife aware of discharge to SNF today.  [facility]  Jilda PandaBethany Rosemarie Galvis RN

## 2016-11-13 NOTE — Clinical Social Work Note (Signed)
CSW facilitated patient discharge including contacting patient family and facility to confirm patient discharge plans. Clinical information faxed to facility and family agreeable with plan. CSW arranged [facility]. RN to call report prior to discharge [phone]).  CSW will sign off for now as social work intervention is no longer needed. Please consult us again if new needs arise.  Charlynn CourtSarah Sherrika Weakland, CSW [phone]

## 2016-11-13 NOTE — Clinical Social Work Note (Signed)
CSW met with patient to notify him that he would be discharging back to [facility] today. Patient stated that he did not want to go back there, that he wanted to go home. CSW asked why and he said they were mean at the SNF. Patient asked if CSW spoke with his wife. CSW stated that I spoke with his wife last week and she confirmed plan to return to [facility]. Patient agreeable to return since his wife said to do so.   CSW called patient's wife to make her aware of plan for discharge. She is agreeable.  Per RN request, CSW will arrange transport for 3:00 pm today.  Dayton Scrape, [place]

## 2016-11-13 NOTE — Progress Notes (Signed)
ANTICOAGULATION CONSULT NOTE - Follow Up Consult  Pharmacy Consult for warfarin Indication: atrial fibrillation  Allergies  Allergen Reactions  . Penicillins Rash    No other information available at this time    Patient Measurements: Height: 5' 7.5" (171.5 cm) Weight: 156 lb (70.8 kg) (bed) IBW/kg (Calculated) : 67.25 Heparin Dosing Weight: 83.6 kg  Vital Signs: Temp: 98.4 F (36.9 C) (12/12 0346) Temp Source: Oral (12/12 0346) BP: 103/70 (12/12 0346) Pulse Rate: 56 (12/12 0346)   Assessment: 65 yoM on warfarinPTA for AFib (home dose = 7.5mg  daily).  Initially bridged with heparin drip while INR subtherapeutic, but discontinued 12/7 once INR > 2.0. INR supratherapeutic today at 3.31, dose not given 12/8, INR trending up likely from 10 mg doses given earlier and low PO intake. INR today is likely reflective of the 5 mg dose given on 12/09. Patient is taking amiodarone and aspirin which can increase the effects of warfarin.   Goal of Therapy:  INR 2-3 Monitor platelets by anticoagulation protocol: Yes   Plan:  Repeat warfarin 1mg  PO x1 tonight Monitor daily INR, CBC, clinical course, s/sx of bleed, PO intake, DDI Patient going to SNF today and will have close follow-up there.    Thank you for allowing us to participate in this patients care. Bailey MechEmily Stewart, PharmD PGY1 Pharmacy Resident  Pager: [phone] 11/13/2016

## 2017-05-03 DEATH — deceased

## 2017-09-01 IMAGING — CT CT HEAD W/O CM
3 of 4 series · 17 of 47 positions shown, 20 images · non-contrast
Comparison: None.

CLINICAL DATA: Altered mental status

EXAM:
CT HEAD WITHOUT CONTRAST
TECHNIQUE: Contiguous axial images were obtained from the base of the skull
through the vertex without intravenous contrast.

[Series 201: head w/o, idose (1) · axial · non-contrast · 0.44mm/px · z∈[+72,+202]mm · 11 of 32 slices shown, 14 images]
[im 3/32  brain]
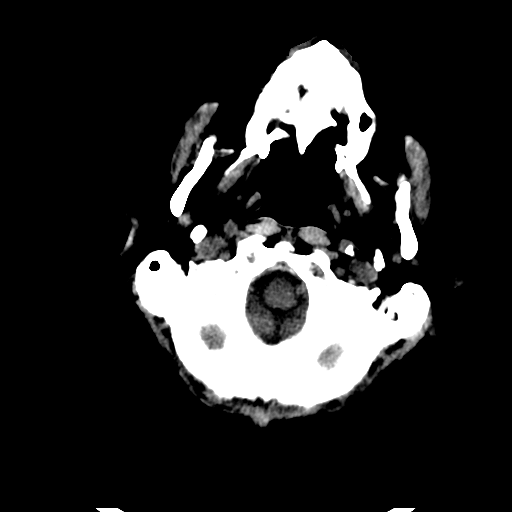
[im 3/32  bone]
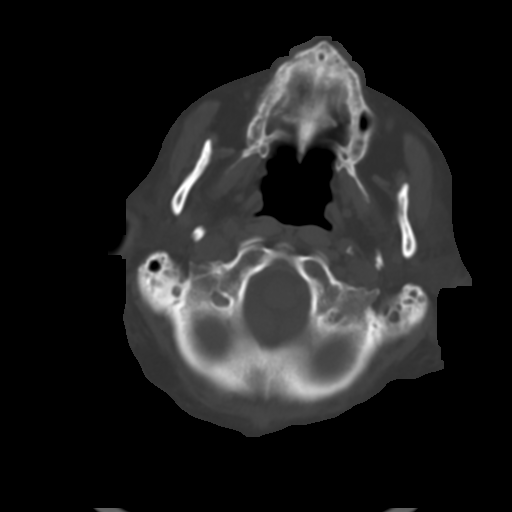
[im 5/32  brain]
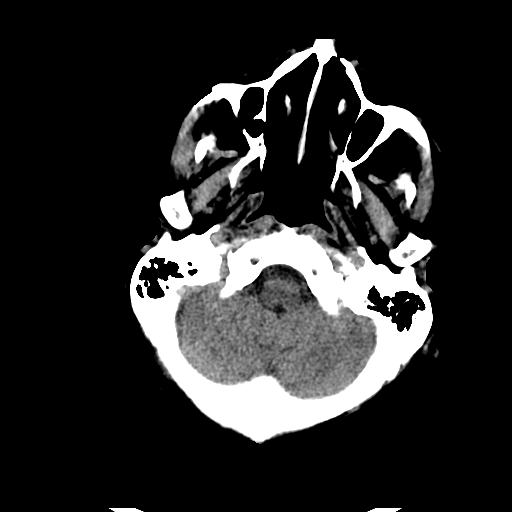
[im 7/32  brain]
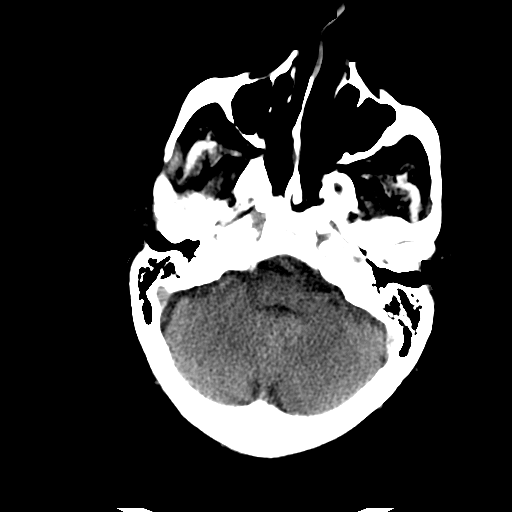
[im 12/32  brain]
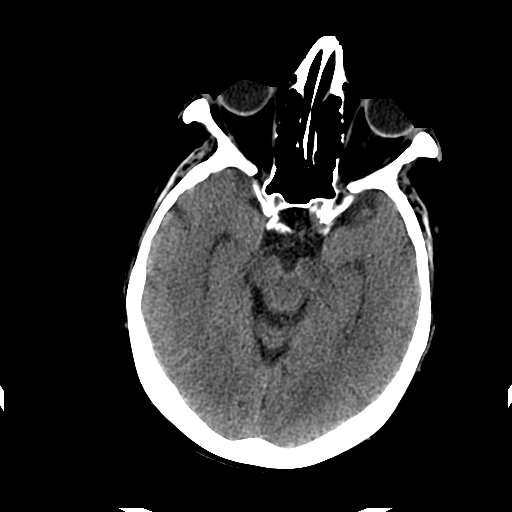
[im 14/32  brain]
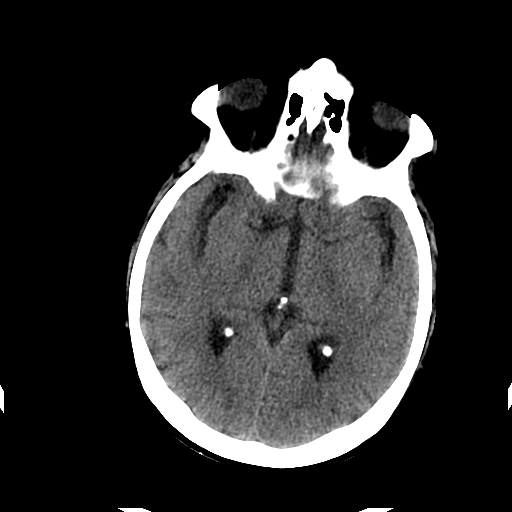
[im 14/32  bone]
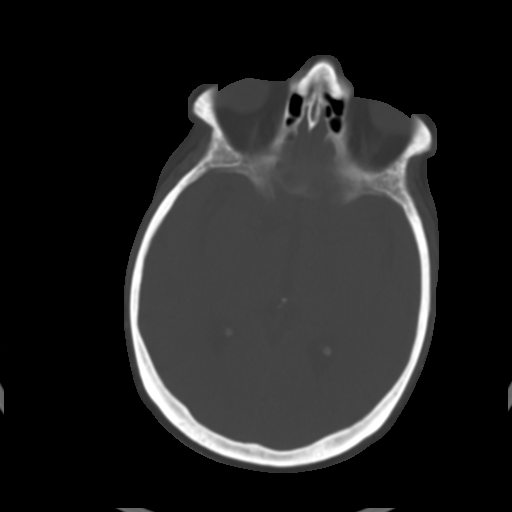
[im 16/32  brain]
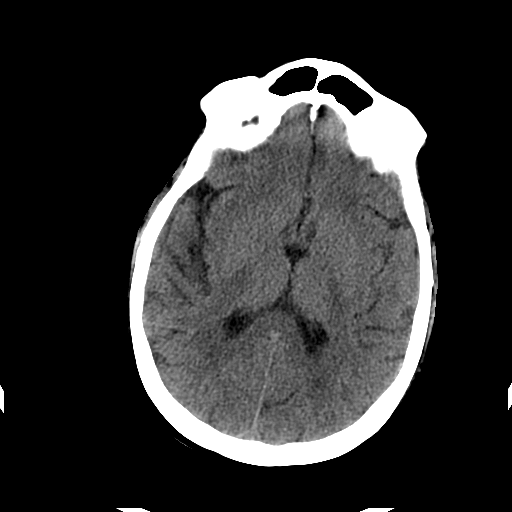
[im 18/32  brain]
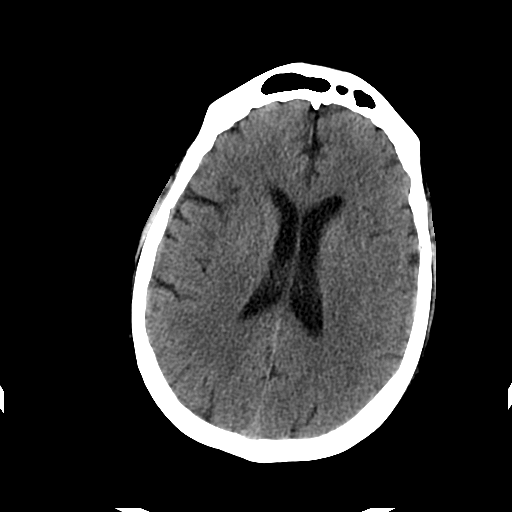
[im 20/32  brain]
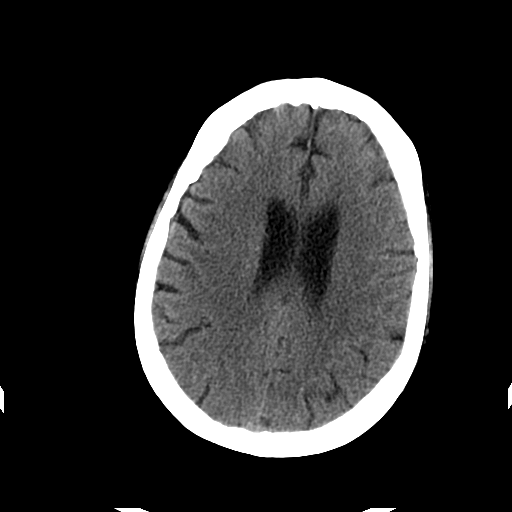
[im 25/32  brain]
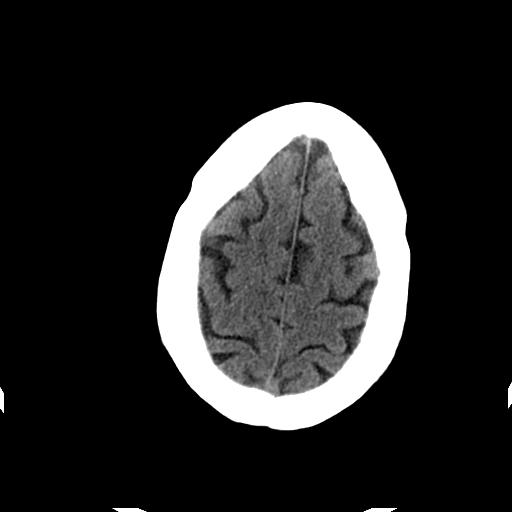
[im 25/32  bone]
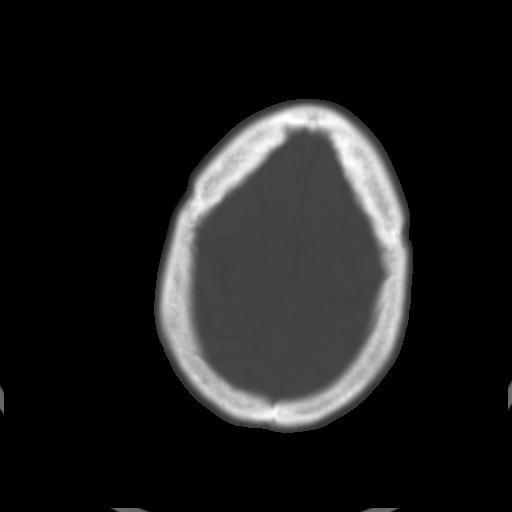
[im 27/32  brain]
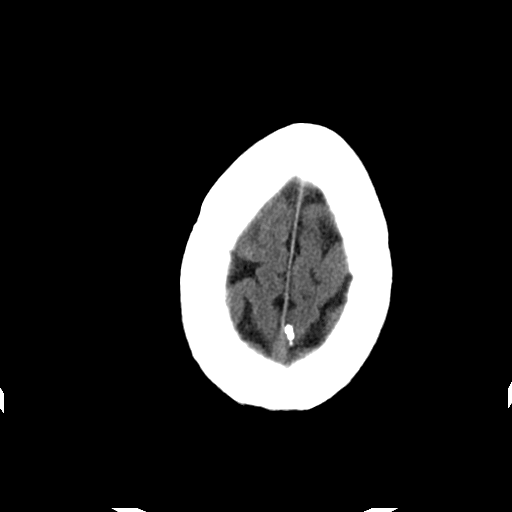
[im 29/32  brain]
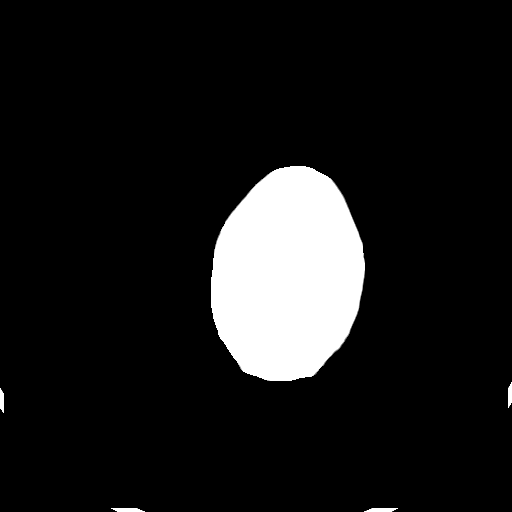

[Series 203: coronal st, idose (1) · coronal · 0.40mm/px · 3 of 75 slices shown]
[im 25/75  brain]
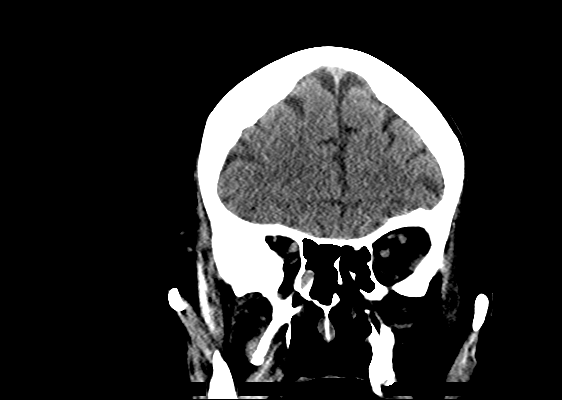
[im 33/75  brain]
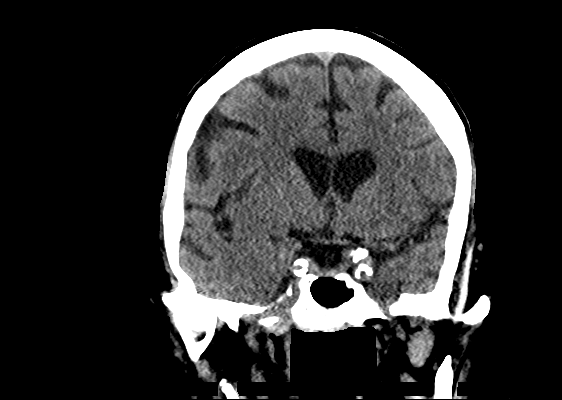
[im 42/75  brain]
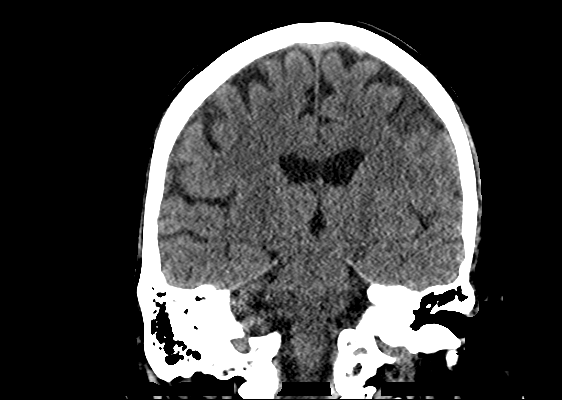

[Series 204: sagittal st, idose (1) · sagittal · 0.40mm/px · 3 of 75 slices shown]
[im 25/75  brain]
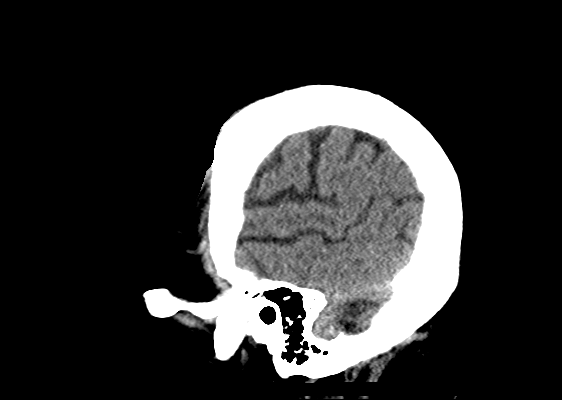
[im 38/75  brain]
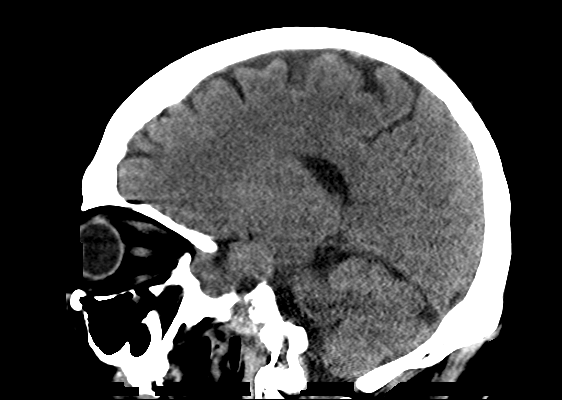
[im 50/75  brain]
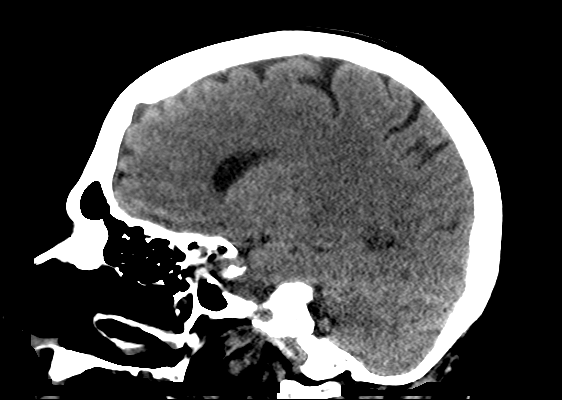

[17 of 47 positions shown; findings below may reference images not displayed]

FINDINGS: Brain: No evidence of acute infarction, hemorrhage, hydrocephalus,
extra-axial collection or mass lesion/mass effect. Mild cerebral
atrophy. Mild periventricular white matter decreased attenuation
probable due to chronic small vessel ischemic changes.

Vascular: Atherosclerotic calcifications of carotid siphon are
noted.

Skull: Normal. Negative for fracture or focal lesion.

Sinuses/Orbits: No acute finding.

Other: None.
IMPRESSION: No acute intracranial abnormality. Mild cerebral atrophy. Mild
periventricular white matter decreased attenuation probable due to
chronic small vessel ischemic changes. Atherosclerotic
calcifications of carotid siphon.

## 2017-09-03 IMAGING — CR DG ABD PORTABLE 1V
1 series · 1 of 1 positions shown · non-contrast
Comparison: 04/20/2016 CT

CLINICAL DATA: Feeding tube placement

EXAM:
PORTABLE ABDOMEN - 1 VIEW

[AP]
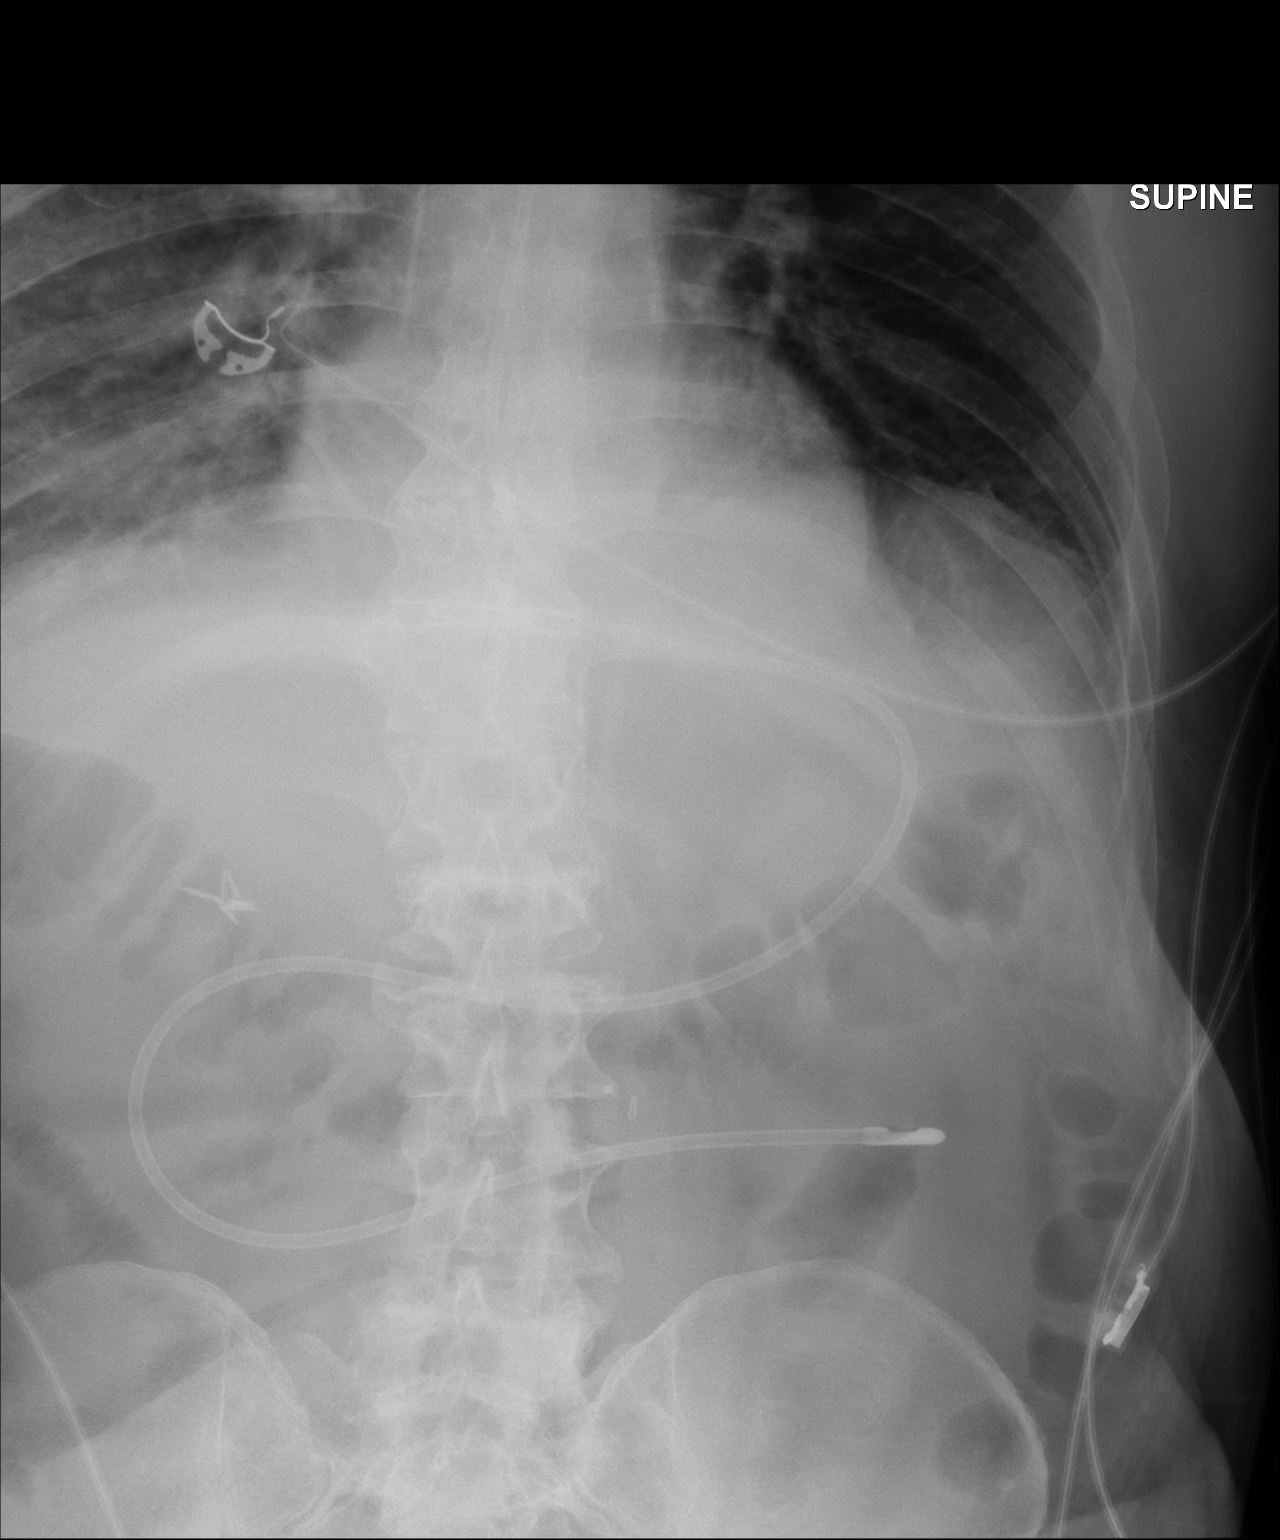

[1 of 1 positions shown; findings below may reference images not displayed]

FINDINGS: The tip of a feeding tube is seen along the distal aspect of the
third portion of the duodenum, possibly at the junction of the
fourth portion. Lung volumes are low with patchy appearance of the
visualized right lung. Surgical clips are seen in the right upper
quadrant. The bowel gas pattern is unremarkable. Chronic superior
endplate compression of L2. Lower lumbar facet arthropathy at L4-5
and L5-S1. No acute appearing osseous abnormality.
IMPRESSION: The tip of a feeding tube is in the distal third portion of the
duodenum possibly at the juncture with the fourth portion.

## 2017-09-06 IMAGING — CR DG CHEST 1V PORT
1 series · 1 of 1 positions shown · non-contrast
Comparison: 10/31/2016 .

CLINICAL DATA: Shortness of breath .

EXAM:
PORTABLE CHEST 1 VIEW

[AP]
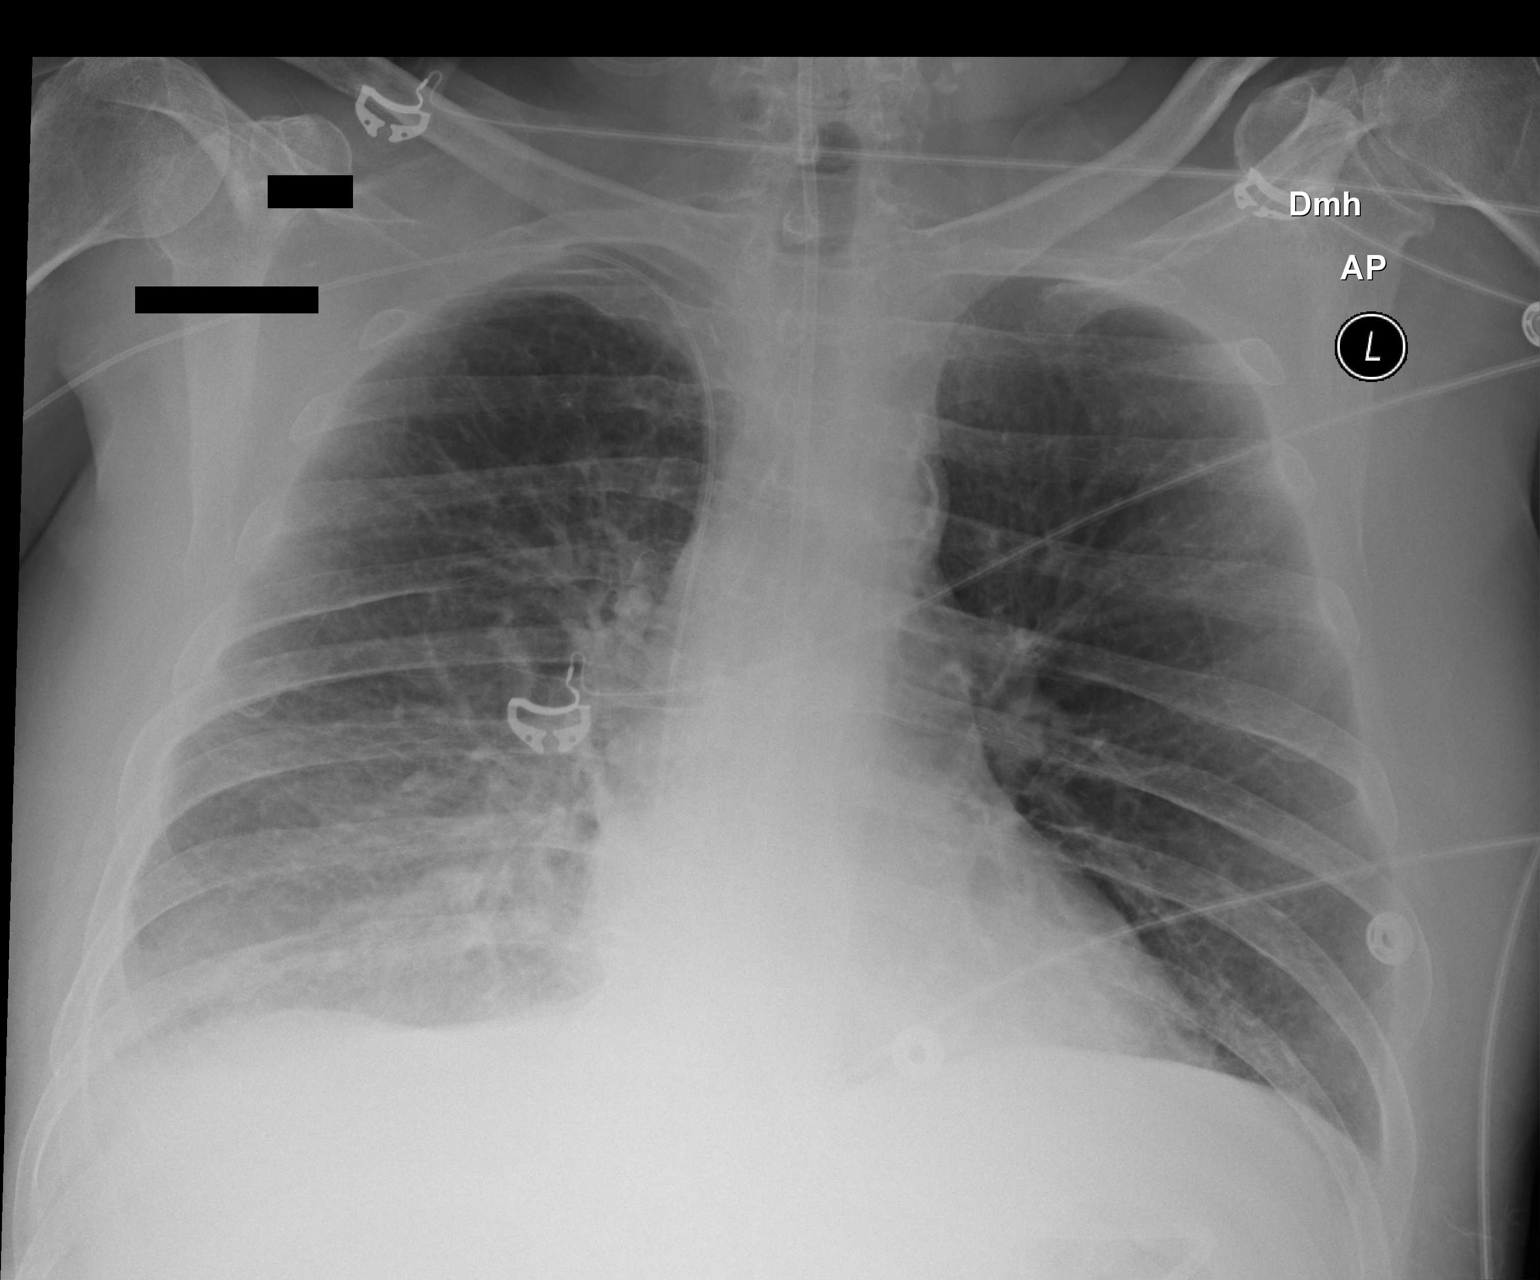

[1 of 1 positions shown; findings below may reference images not displayed]

FINDINGS: Feeding tube in stable position. Right PICC line stable position.
Heart size stable. Right lower lobe infiltrate consistent pneumonia
noted. Small right pleural effusion cannot be excluded .
IMPRESSION: 1. Feeding tube and right PICC line stable position.

2. Right lower lobe infiltrate consistent with pneumonia again
noted. No change. Small right pleural effusion again noted.

## 2017-09-17 IMAGING — CR DG ABDOMEN 2V
2 series · 2 of 2 positions shown · non-contrast
Comparison: Single-view of the abdomen 10/31/2016 and 10/29/2016.

CLINICAL DATA: Abdominal pain.  Possible small bowel obstruction.

EXAM:
ABDOMEN - 2 VIEW

[abdomen supine]
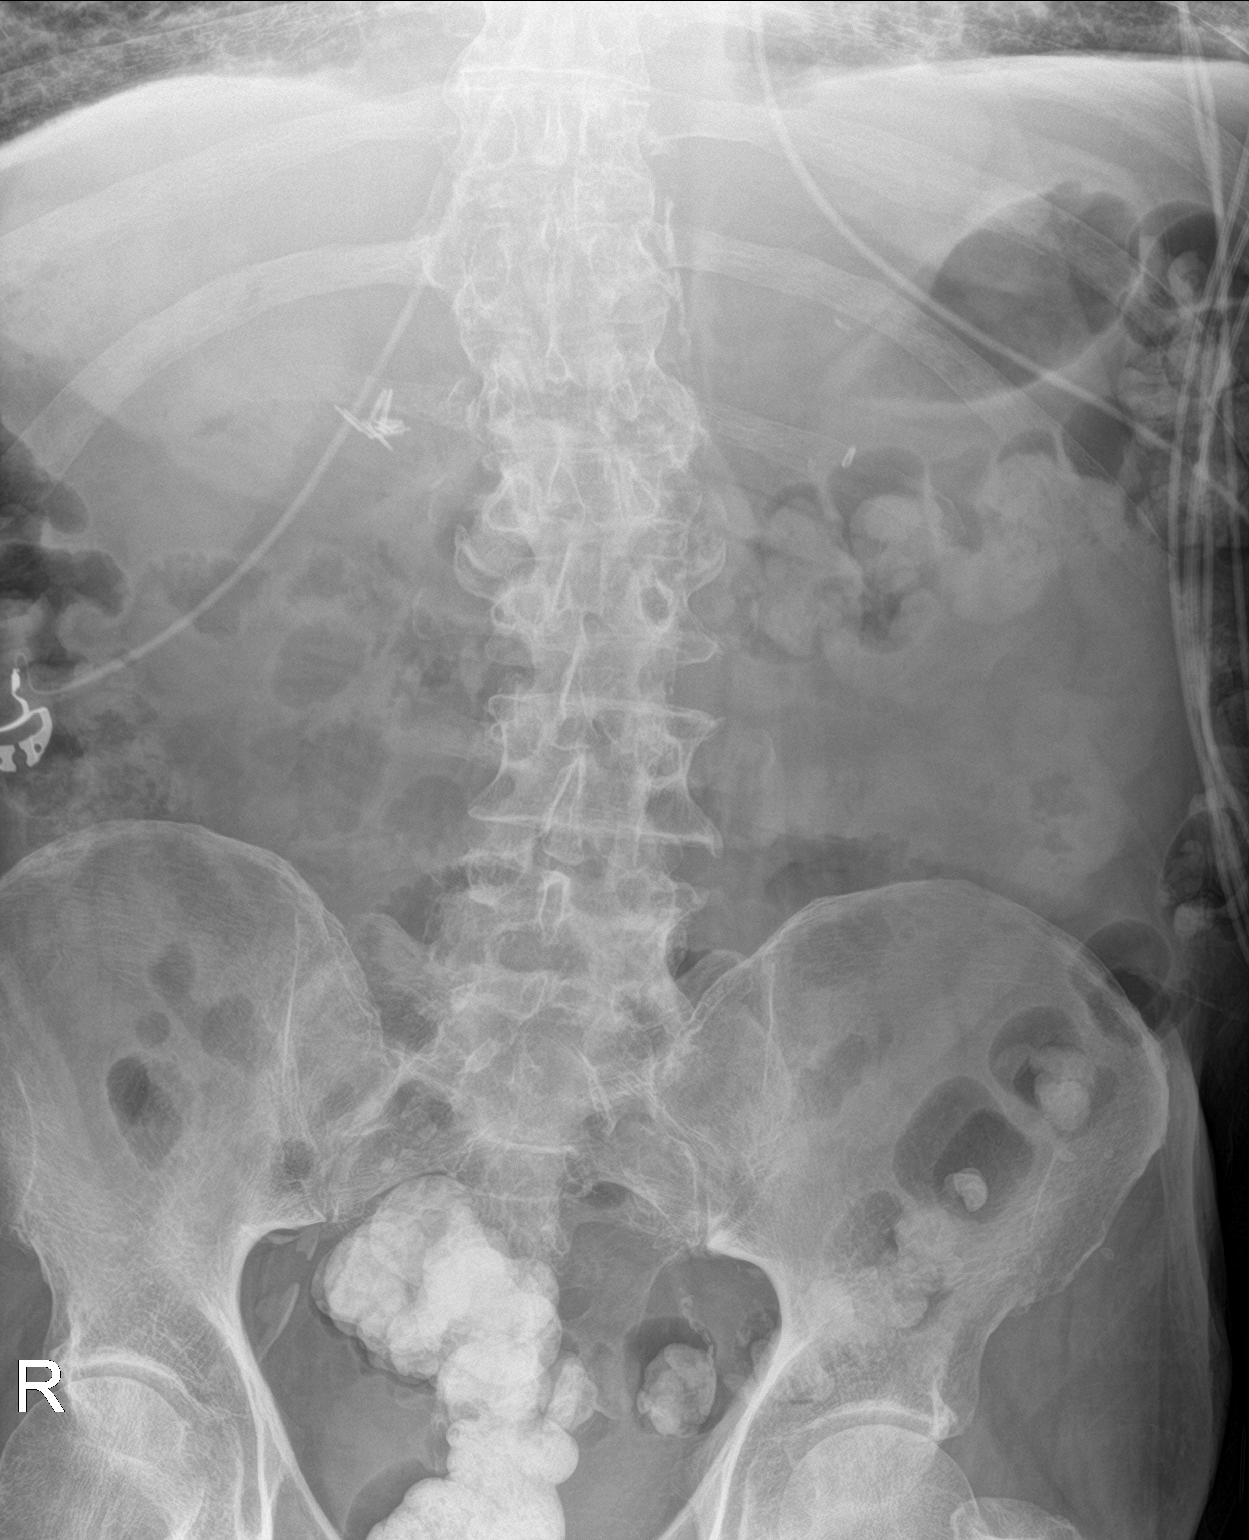

[abdomen decu]
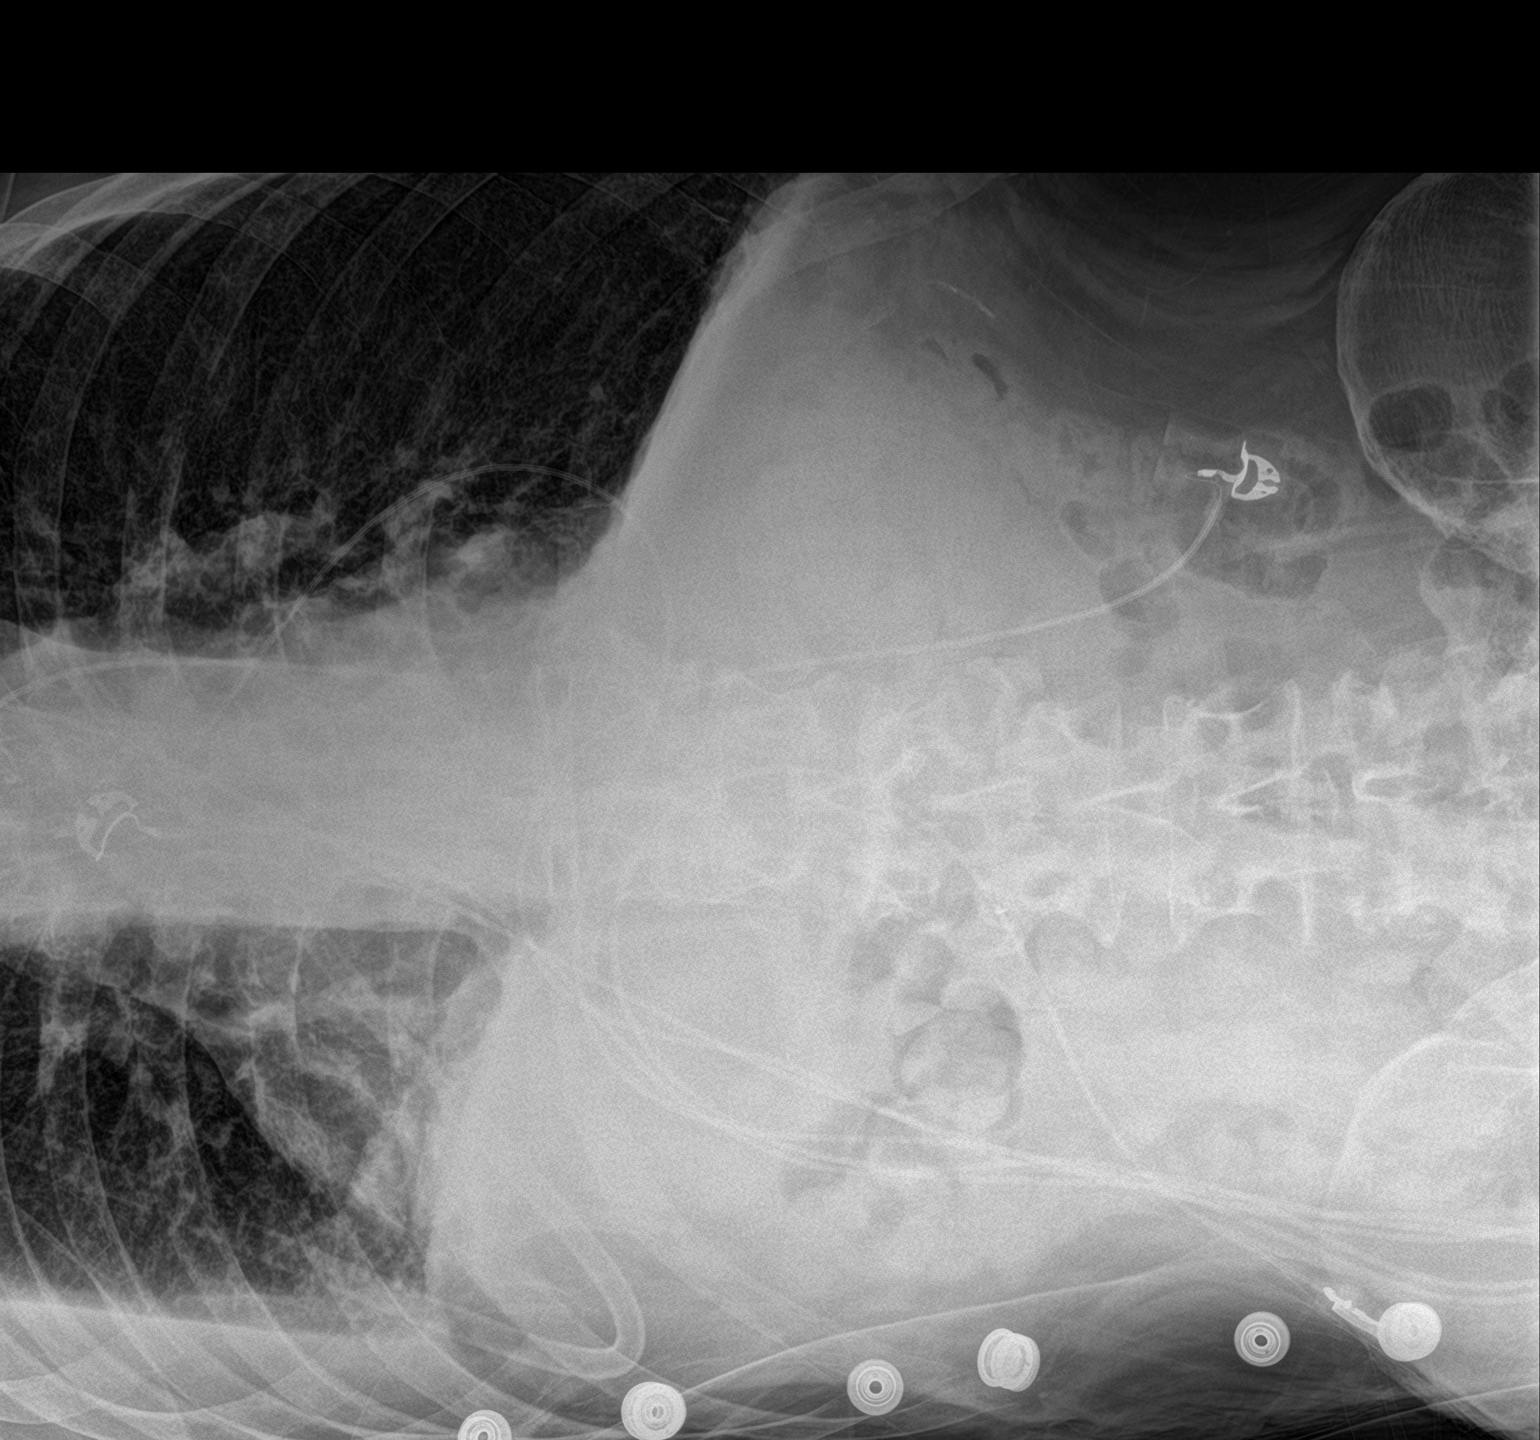

[2 of 2 positions shown; findings below may reference images not displayed]

FINDINGS: The bowel gas pattern is normal. Contrast material is seen in the
rectosigmoid colon. Convex left scoliosis is noted. The patient is
status post cholecystectomy.
IMPRESSION: Negative for bowel obstruction.  No acute abnormality.
